# Patient Record
Sex: Female | Born: 1985 | Race: Black or African American | Hispanic: No | Marital: Single | Smoking: Never smoker
Health system: Southern US, Community
[De-identification: ages and names within clinical notes are randomized; demographics above are authoritative.]

## PROBLEM LIST (undated history)

## (undated) ENCOUNTER — Inpatient Hospital Stay (HOSPITAL_COMMUNITY): Payer: Self-pay

## (undated) DIAGNOSIS — Z789 Other specified health status: Secondary | ICD-10-CM

## (undated) DIAGNOSIS — Z227 Latent tuberculosis: Secondary | ICD-10-CM

## (undated) DIAGNOSIS — H9190 Unspecified hearing loss, unspecified ear: Secondary | ICD-10-CM

## (undated) HISTORY — DX: Latent tuberculosis: Z22.7

## (undated) HISTORY — DX: Unspecified hearing loss, unspecified ear: H91.90

## (undated) HISTORY — PX: NO PAST SURGERIES: SHX2092

---

## 2015-01-07 DIAGNOSIS — R7611 Nonspecific reaction to tuberculin skin test without active tuberculosis: Secondary | ICD-10-CM | POA: Insufficient documentation

## 2015-01-07 DIAGNOSIS — Z789 Other specified health status: Secondary | ICD-10-CM | POA: Insufficient documentation

## 2015-06-11 DIAGNOSIS — H9191 Unspecified hearing loss, right ear: Secondary | ICD-10-CM | POA: Insufficient documentation

## 2018-02-14 NOTE — L&D Delivery Note (Signed)
OB/GYN Faculty Practice Delivery Note  Kylii Ennis is a 33 y.o. [id number] s/p SVD at [redacted]w[redacted]d. She was admitted for SOL with concern for vaginal bleeding in MAU.   ROM: 1h 12m with clear fluid GBS Status:  Negative/-- (09/21 1627) Maximum Maternal Temperature: 98.21F  Labor Progress: . Patient presented to L&D for SOL. Initial SVE: 5/80/-3. Patient received AROM and Pitocin and quickly then progressed to complete.   Delivery Date/Time: 10/8 @ 1639 Delivery: Called to room and patient was complete and pushing. Head delivered in LOA position. No nuchal cord present. Shoulder and body delivered in usual fashion. Infant with spontaneous cry, placed on mother's abdomen, dried and stimulated. Cord clamped x 2 after 1-minute delay, and cut by medical student present. Cord blood drawn. Placenta delivered spontaneously with gentle cord traction. Fundus firm with massage and Pitocin. Labia, perineum, vagina, and cervix inspected inspected with no lacerations.  Baby Weight: pending  Placenta: Sent to L&D Complications: None Lacerations: None EBL: 100 mL Analgesia: None   Infant: APGAR (1 MIN): 8   APGAR (5 MINS): 9   APGAR (10 MINS):     Barrington Ellison, MD [facility] Family Medicine Fellow, [facility] for [facility] 11/22/2018, 4:59 PM

## 2018-06-12 ENCOUNTER — Inpatient Hospital Stay (HOSPITAL_COMMUNITY): Payer: Medicaid Other

## 2018-06-12 ENCOUNTER — Encounter (HOSPITAL_COMMUNITY): Payer: Self-pay | Admitting: *Deleted

## 2018-06-12 ENCOUNTER — Other Ambulatory Visit: Payer: Self-pay

## 2018-06-12 ENCOUNTER — Inpatient Hospital Stay (HOSPITAL_COMMUNITY)
Admission: EM | Admit: 2018-06-12 | Discharge: 2018-06-12 | Disposition: A | Discharge status: Home / Self Care | Payer: Medicaid Other | Attending: Obstetrics and Gynecology | Admitting: Obstetrics and Gynecology

## 2018-06-12 DIAGNOSIS — M79606 Pain in leg, unspecified: Secondary | ICD-10-CM | POA: Diagnosis not present

## 2018-06-12 DIAGNOSIS — Z3A15 15 weeks gestation of pregnancy: Secondary | ICD-10-CM | POA: Insufficient documentation

## 2018-06-12 DIAGNOSIS — R102 Pelvic and perineal pain: Secondary | ICD-10-CM | POA: Diagnosis not present

## 2018-06-12 DIAGNOSIS — M545 Low back pain, unspecified: Secondary | ICD-10-CM

## 2018-06-12 DIAGNOSIS — O26892 Other specified pregnancy related conditions, second trimester: Secondary | ICD-10-CM | POA: Insufficient documentation

## 2018-06-12 DIAGNOSIS — R519 Headache, unspecified: Secondary | ICD-10-CM

## 2018-06-12 DIAGNOSIS — O219 Vomiting of pregnancy, unspecified: Secondary | ICD-10-CM | POA: Insufficient documentation

## 2018-06-12 DIAGNOSIS — K117 Disturbances of salivary secretion: Secondary | ICD-10-CM | POA: Diagnosis not present

## 2018-06-12 DIAGNOSIS — O26899 Other specified pregnancy related conditions, unspecified trimester: Secondary | ICD-10-CM

## 2018-06-12 DIAGNOSIS — R51 Headache: Secondary | ICD-10-CM | POA: Diagnosis not present

## 2018-06-12 DIAGNOSIS — R109 Unspecified abdominal pain: Secondary | ICD-10-CM | POA: Insufficient documentation

## 2018-06-12 HISTORY — DX: Other specified health status: Z78.9

## 2018-06-12 LAB — CBC
HCT: 35.6 % — ABNORMAL LOW (ref 36.0–46.0)
Hemoglobin: 12.8 g/dL (ref 12.0–15.0)
MCH: 31.4 pg (ref 26.0–34.0)
MCHC: 36 g/dL (ref 30.0–36.0)
MCV: 87.3 fL (ref 80.0–100.0)
Platelets: 199 10*3/uL (ref 150–400)
RBC: 4.08 MIL/uL (ref 3.87–5.11)
RDW: 13.2 % (ref 11.5–15.5)
WBC: 5.4 10*3/uL (ref 4.0–10.5)
nRBC: 0 % (ref 0.0–0.2)

## 2018-06-12 LAB — WET PREP, GENITAL
Clue Cells Wet Prep HPF POC: NONE SEEN
Sperm: NONE SEEN
Trich, Wet Prep: NONE SEEN
Yeast Wet Prep HPF POC: NONE SEEN

## 2018-06-12 LAB — COMPREHENSIVE METABOLIC PANEL
ALT: 10 U/L (ref 0–44)
AST: 15 U/L (ref 15–41)
Albumin: 3.4 g/dL — ABNORMAL LOW (ref 3.5–5.0)
Alkaline Phosphatase: 35 U/L — ABNORMAL LOW (ref 38–126)
Anion gap: 11 (ref 5–15)
BUN: 5 mg/dL — ABNORMAL LOW (ref 6–20)
CO2: 19 mmol/L — ABNORMAL LOW (ref 22–32)
Calcium: 9.8 mg/dL (ref 8.9–10.3)
Chloride: 106 mmol/L (ref 98–111)
Creatinine, Ser: 0.5 mg/dL (ref 0.44–1.00)
GFR calc Af Amer: >60 mL/min (ref >60)
GFR calc non Af Amer: >60 mL/min (ref >60)
Glucose, Bld: 84 mg/dL (ref 70–99)
Potassium: 3.7 mmol/L (ref 3.5–5.1)
Sodium: 136 mmol/L (ref 135–145)
Total Bilirubin: 0.6 mg/dL (ref 0.3–1.2)
Total Protein: 6.4 g/dL — ABNORMAL LOW (ref 6.5–8.1)

## 2018-06-12 LAB — URINALYSIS, ROUTINE W REFLEX MICROSCOPIC
Bilirubin Urine: NEGATIVE
Glucose, UA: 50 mg/dL — AB
Hgb urine dipstick: NEGATIVE
Ketones, ur: 80 mg/dL — AB
Nitrite: NEGATIVE
Protein, ur: 30 mg/dL — AB
Specific Gravity, Urine: 1.03 (ref 1.005–1.030)
pH: 5 (ref 5.0–8.0)

## 2018-06-12 LAB — POCT PREGNANCY, URINE: Preg Test, Ur: POSITIVE — AB

## 2018-06-12 LAB — HCG, QUANTITATIVE, PREGNANCY: hCG, Beta Chain, Quant, S: 49258 m[IU]/mL — ABNORMAL HIGH (ref <5)

## 2018-06-12 MED ORDER — FOLIC ACID 400 MCG PO TABS: 400.0000 ug | ORAL_TABLET | Freq: Every day | ORAL | 2 refills | Status: DC

## 2018-06-12 MED ORDER — GLYCOPYRROLATE 1 MG PO TABS: 1.0000 mg | ORAL_TABLET | Freq: Three times a day (TID) | ORAL | 0 refills | Status: DC

## 2018-06-12 MED ORDER — ONDANSETRON 8 MG PO TBDP: 8.0000 mg | ORAL_TABLET | Freq: Three times a day (TID) | ORAL | 0 refills | Status: DC | PRN

## 2018-06-12 MED ORDER — ONDANSETRON HCL 4 MG/2ML IJ SOLN
4.0000 mg | Freq: Once | INTRAMUSCULAR | Status: AC
  Administered 2018-06-12: 4 mg via INTRAVENOUS
  Filled 2018-06-12: qty 2

## 2018-06-12 MED ORDER — LACTATED RINGERS IV BOLUS
1000.0000 mL | Freq: Once | INTRAVENOUS | Status: AC
  Administered 2018-06-12: 1000 mL via INTRAVENOUS

## 2018-06-12 MED ORDER — HYDROMORPHONE HCL 1 MG/ML IJ SOLN
1.0000 mg | Freq: Once | INTRAMUSCULAR | Status: AC
  Administered 2018-06-12: 1 mg via INTRAVENOUS
  Filled 2018-06-12: qty 1

## 2018-06-12 NOTE — MAU Note (Addendum)
Feels weak today, tired to breath.  Feels nauseated and wants to spit from time to time

## 2018-06-12 NOTE — MAU Note (Signed)
Having a very bad headache, pain in low back, lower abd and in her legs.  Started last night. lmp in Jan, saw dr in Feb, was told she is preg.Marland Kitchen  Has not been seen since.

## 2018-06-12 NOTE — MAU Provider Note (Signed)
History     CSN: [id number]  Arrival date and time: 06/12/18 1212   First Provider Initiated Contact with Patient 06/12/18 1330      Chief Complaint  Patient presents with  . Back Pain  . Headache  . Leg Pain  . Abdominal Pain   Ms. Lavon Horn is a 33 y.o. [id number] at Unknown who presents to MAU for HA, backache, lower abdominal pain, leg pain. Pt reports LMP 02/2018, exact day unknown. Pt denies any PMH.  Onset: yesterday evening Location: head, low back, lower abdomen, upper thighs Duration: <24hrs Character: burning sensation in back/lower abdomen/legs Aggravating/Associated: none/none  Relieving: none Treatment: none Severity: HA "severe, very bad, painful"  Pt reports on-going nausea with desire to spit constantly. Pt reports she carries around a cup to spit in. Pt was seen by family medicine and given RX for nausea medication, but pt reports she did not take the nausea medication as prescribed because she reports it made her vomit more. Pt does not remember the name of the medication that was given to her for nausea. Pt also given PNVs at this same visit and reports they made her more nauseous as well, so she stopped taking those. Pt reports she vomited once this morning.  Pt reports "a kind of liquid" coming out of the vagina. Denies odor/itching.  Pt denies VB. Pt denies constipation, diarrhea, or urinary problems. Pt denies fever, chills, fatigue, sweating or changes in appetite. Pt denies SOB or chest pain. Pt denies dizziness, light-headedness, weakness.  Problems this pregnancy include: none, pt has not yet been seen this pregnancy. Allergies? NKDA Current medications/supplements? none Pt is not currently breastfeeding. Prenatal care provider? none, pt has not yet been seen this pregnancy  Translation services used for entire visit.   OB History    Gravida  5   Para  3   Term      Preterm      AB  1   Living  3     SAB  1   TAB       Ectopic      Multiple      Live Births  3           Past Medical History:  Diagnosis Date  . Medical history non-contributory     Past Surgical History:  Procedure Laterality Date  . NO PAST SURGERIES      History reviewed. No pertinent family history.  Social History   Tobacco Use  . Smoking status: Never Smoker  . Smokeless tobacco: Never Used  Substance Use Topics  . Alcohol use: Never    Frequency: Never  . Drug use: Never   Allergies: No Known Allergies  No medications prior to admission.    Review of Systems  Constitutional: Negative for appetite change, chills, diaphoresis, fatigue and fever.  Respiratory: Negative for shortness of breath.   Cardiovascular: Negative for chest pain.  Gastrointestinal: Positive for abdominal pain, nausea and vomiting. Negative for constipation and diarrhea.  Genitourinary: Positive for pelvic pain and vaginal discharge. Negative for dysuria, flank pain, frequency, urgency and vaginal bleeding.  Musculoskeletal: Positive for back pain.  Neurological: Positive for headaches. Negative for dizziness, weakness and light-headedness.   Physical Exam   Blood pressure (!) 102/58, pulse (!) 101, temperature 98.8 F (37.1 C), temperature source Oral, resp. rate 20, height  (1.626 m), weight 69.8 kg, SpO2 100 %.   Patient Vitals for the past 24 hrs:  BP Temp Temp  src Pulse Resp SpO2 Height Weight  06/12/18 1850 (!) 102/58 98.8 F (37.1 C) Oral (!) 101 20 100 % - -  06/12/18 1259 (!) 92/45 98.8 F (37.1 C) Oral 87 18 100 % 5\' 4"  (1.626 m) 69.8 kg   Physical Exam  Constitutional: She is oriented to person, place, and time. She appears well-developed and well-nourished. No distress.  HENT:  Head: Normocephalic and atraumatic.  Respiratory: Effort normal.  GI: Soft. She exhibits no distension and no mass. There is abdominal tenderness in the right lower quadrant, suprapubic area and left lower quadrant. There is no  rebound and no guarding.  Genitourinary: There is no rash, tenderness or lesion on the right labia. There is no rash, tenderness or lesion on the left labia. Uterus is tender. Uterus is not fixed. Cervix exhibits no motion tenderness, no discharge and no friability. Right adnexum displays tenderness. Right adnexum displays no mass and no fullness. Left adnexum displays tenderness. Left adnexum displays no mass and no fullness.    Vaginal discharge (thin, white) and tenderness present.     No vaginal bleeding.  There is tenderness in the vagina. No bleeding in the vagina.    Genitourinary Comments: Cervix appears closed visually, closed on bimanual exam   Neurological: She is alert and oriented to person, place, and time.  Skin: Skin is warm and dry. She is not diaphoretic.  Psychiatric: She has a normal mood and affect. Her behavior is normal.   Results for orders placed or performed during the hospital encounter of 06/12/18 (from the past 24 hour(s))  Pregnancy, urine POC     Status: Abnormal   Collection Time: 06/12/18  1:17 PM  Result Value Ref Range   Preg Test, Ur POSITIVE (A) NEGATIVE  Urinalysis, Routine w reflex microscopic     Status: Abnormal   Collection Time: 06/12/18  1:18 PM  Result Value Ref Range   Color, Urine AMBER (A) YELLOW   APPearance CLEAR CLEAR   Specific Gravity, Urine 1.030 1.005 - 1.030   pH 5.0 5.0 - 8.0   Glucose, UA 50 (A) NEGATIVE mg/dL   Hgb urine dipstick NEGATIVE NEGATIVE   Bilirubin Urine NEGATIVE NEGATIVE   Ketones, ur 80 (A) NEGATIVE mg/dL   Protein, ur 30 (A) NEGATIVE mg/dL   Nitrite NEGATIVE NEGATIVE   Leukocytes,Ua SMALL (A) NEGATIVE   RBC / HPF 0-5 0 - 5 RBC/hpf   WBC, UA 6-10 0 - 5 WBC/hpf   Bacteria, UA RARE (A) NONE SEEN   Squamous Epithelial / LPF 0-5 0 - 5   Mucus PRESENT   Wet prep, genital     Status: Abnormal   Collection Time: 06/12/18  1:42 PM  Result Value Ref Range   Yeast Wet Prep HPF POC NONE SEEN NONE SEEN   Trich, Wet Prep  NONE SEEN NONE SEEN   Clue Cells Wet Prep HPF POC NONE SEEN NONE SEEN   WBC, Wet Prep HPF POC MANY (A) NONE SEEN   Sperm NONE SEEN   CBC     Status: Abnormal   Collection Time: 06/12/18  2:35 PM  Result Value Ref Range   WBC 5.4 4.0 - 10.5 K/uL   RBC 4.08 3.87 - 5.11 MIL/uL   Hemoglobin 12.8 12.0 - 15.0 g/dL   HCT 45.3 (L) 64.6 - 80.3 %   MCV 87.3 80.0 - 100.0 fL   MCH 31.4 26.0 - 34.0 pg   MCHC 36.0 30.0 - 36.0 g/dL   RDW 21.2  11.5 - 15.5 %   Platelets 199 150 - 400 K/uL   nRBC 0.0 0.0 - 0.2 %  Comprehensive metabolic panel     Status: Abnormal   Collection Time: 06/12/18  2:35 PM  Result Value Ref Range   Sodium 136 135 - 145 mmol/L   Potassium 3.7 3.5 - 5.1 mmol/L   Chloride 106 98 - 111 mmol/L   CO2 19 (L) 22 - 32 mmol/L   Glucose, Bld 84 70 - 99 mg/dL   BUN 5 (L) 6 - 20 mg/dL   Creatinine, Ser 1.61 0.44 - 1.00 mg/dL   Calcium 9.8 8.9 - 09.6 mg/dL   Total Protein 6.4 (L) 6.5 - 8.1 g/dL   Albumin 3.4 (L) 3.5 - 5.0 g/dL   AST 15 15 - 41 U/L   ALT 10 0 - 44 U/L   Alkaline Phosphatase 35 (L) 38 - 126 U/L   Total Bilirubin 0.6 0.3 - 1.2 mg/dL   GFR calc non Af Amer >60 >60 mL/min   GFR calc Af Amer >60 >60 mL/min   Anion gap 11 5 - 15  ABO/Rh     Status: None   Collection Time: 06/12/18  2:35 PM  Result Value Ref Range   ABO/RH(D)      O POS Performed at [facility], [address] [place], [place] [address]   hCG, quantitative, pregnancy     Status: Abnormal   Collection Time: 06/12/18  2:35 PM  Result Value Ref Range   hCG, Beta Chain, Quant, S 49,258 (H) <5 mIU/mL   [place] Mfm Ob Comp + 14 Wk  Result Date: 06/12/2018 ----------------------------------------------------------------------  OBSTETRICS REPORT                       (Signed Final 06/12/2018 05:00 pm) ---------------------------------------------------------------------- Patient Info  ID #:       [id number]                          D.O.B.:  16-Sep-1985 (33 yrs)  Name:       Jessica Bender                       Visit Date: 06/12/2018 04:39 pm              [place] ---------------------------------------------------------------------- Performed By  Performed By:     Benjamine Mola Vics             Referred By:      Marylen Ponto                    RDMS,RVT  Attending:        Lin Landsman      [facility] ---------------------------------------------------------------------- Orders   #  Description                          Code         Ordered By   1  [place] MFM OB COMP +  14 WK               76805.01     Kerry Chisolm  ----------------------------------------------------------------------   #  Order #                    Accession #                 Episode #   1  [id number]                  [id number]                  [id number]  ---------------------------------------------------------------------- Indications   Pelvic pain affecting pregnancy in second      O26.892   trimester   [redacted] weeks gestation of pregnancy                Z3A.15  ---------------------------------------------------------------------- Fetal Evaluation  Num Of Fetuses:         1  Fetal Heart Rate(bpm):  154  Cardiac Activity:       Observed  Presentation:           Breech  Placenta:               Posterior  P. Cord Insertion:      Marginal insertion  Amniotic Fluid  AFI FV:      Within normal limits                              Largest Pocket(cm)                              3.64 ---------------------------------------------------------------------- Biometry  BPD:      31.3  mm     G. Age:  15w 6d         64  %    CI:        75.88   %    70 - 86                                                          FL/HC:      15.3   %    15.3 - 17.1  HC:      113.9  mm     G. Age:  15w 4d         40  %    HC/AC:      1.30        1.05 - 1.39  AC:       87.8  mm     G. Age:  15w 0d         39  %    FL/BPD:     55.6   %  FL:       17.4  mm     G. Age:  15w 1d         34  %     FL/AC:      19.8   %    20 - 24  HUM:        17  mm     G. Age:  14w 6d  36  %  CER:      14.8  mm     G. Age:  14w 6d         36  %  NFT:       2.4  mm  LV:          6  mm  CM:          3  mm  Est. FW:     116  gm      0 lb 4 oz     67  % ---------------------------------------------------------------------- OB History  Gravidity:    5         Term:   3         SAB:   1  Living:       3 ---------------------------------------------------------------------- Gestational Age  U/S Today:     15w 3d                                        EDD:   12/01/18  Best:          15w 3d     Det. By:  [redacted] weeks gestation of    EDD:   12/01/18                                      pregnancy ---------------------------------------------------------------------- Anatomy  Cranium:               Appears normal         LVOT:                   Not well visualized  Cavum:                 Appears normal         Aortic Arch:            Not well visualized  Ventricles:            Appears normal         Ductal Arch:            Not well visualized  Choroid Plexus:        Appears normal         Diaphragm:              Not well visualized  Cerebellum:            Appears normal         Stomach:                Appears normal, left                                                                        sided  Posterior Fossa:       Appears normal         Abdomen:                Appears normal  Nuchal Fold:           Appears normal  Abdominal Wall:         Appears nml (cord                                                                        insert, abd wall)  Face:                  Not well visualized    Cord Vessels:           Appears normal (3                                                                        vessel cord)  Lips:                  Not well visualized    Kidneys:                Appear normal  Palate:                Not well visualized    Bladder:                Appears normal  Thoracic:              Appears normal          Spine:                  Not well visualized  Heart:                 Not well visualized    Upper Extremities:      Visualized  RVOT:                  Not well visualized    Lower Extremities:      Visualized  Other:  Technically difficult due to early gestational age and fetal position. ---------------------------------------------------------------------- Cervix Uterus Adnexa  Cervix  Length:            5.6  cm.  Normal appearance by transabdominal scan.  Uterus  No abnormality visualized.  Left Ovary  Size(cm)     2.97   x   1.91   x  1.97      Vol(ml): 5.85  Within normal limits.  Right Ovary  Not visualized. ---------------------------------------------------------------------- Impression  Normal interval growth.  No ultrasonic evidence of structural  fetal anomalies.  Suboptimal views of the fetal anatomy was obtained  secondary to early gestational age. ---------------------------------------------------------------------- Recommendations  Follow up anatomy in 5 weeks ----------------------------------------------------------------------               Lin Landsmanorenthian Booker, MD Electronically Signed Final Report   06/12/2018 05:00 pm ----------------------------------------------------------------------   MAU Course  Procedures  MDM -pain (HA, backache, lower abdominal pain, upper thighs)       -1mg  Dilaudid +1L LR given, pain resolved in abdomen/back/thighs, HA lessened significantly -nausea with desire to spit constantly       -Zofran given IV, nausea almost completely  resolved, pt reports mild       -PO Challenge successful       -RX given for Zofran + Robinul on discharge -UA: amber/50GLU/80 ketones/30PRO/leuks small/rare bacteria, sending for culture -WetPrep: +WBCs, otherwise WNL -GC/CT collected -CBC: WNL, H/H 12.8/35.6 -CMP: WNL for pregnancy -ABO: O Positive -hCG: 49,258 -[place]: [redacted]w[redacted]d, CL 5.6cm, marginal cord insertion, posterior placenta, AFI WNL, large placental lake on  images -relayed above information to patient through translation services -pt discharged to home in stable condition  Orders Placed This Encounter  Procedures  . Wet prep, genital    Standing Status:   Standing    Number of Occurrences:   1  . Culture, OB Urine    Standing Status:   Standing    Number of Occurrences:   1  . [place] MFM OB COMP + 14 WK    Standing Status:   Standing    Number of Occurrences:   1    Order Specific Question:   Symptom/Reason for Exam    Answer:   Pelvic pain in pregnancy [960454]  . Urinalysis, Routine w reflex microscopic    Standing Status:   Standing    Number of Occurrences:   1  . CBC    Standing Status:   Standing    Number of Occurrences:   1  . Comprehensive metabolic panel    Standing Status:   Standing    Number of Occurrences:   1  . hCG, quantitative, pregnancy    Standing Status:   Standing    Number of Occurrences:   1  . Pregnancy, urine POC    Standing Status:   Standing    Number of Occurrences:   1  . ABO/Rh    Standing Status:   Standing    Number of Occurrences:   1  . Insert peripheral IV    Standing Status:   Standing    Number of Occurrences:   1  . Discharge patient    Order Specific Question:   Discharge disposition    Answer:   01-Home or Self Care [1]    Order Specific Question:   Discharge patient date    Answer:   06/12/2018   Meds ordered this encounter  Medications  . lactated ringers bolus 1,000 mL  . HYDROmorphone (DILAUDID) injection 1 mg  . ondansetron (ZOFRAN) injection 4 mg  . folic acid (FOLVITE) 400 MCG tablet    Sig: Take 1 tablet (400 mcg total) by mouth daily.    Dispense:  60 tablet    Refill:  2    Order Specific Question:   Supervising Provider    Answer:   Levie Heritage [[id number]]  . glycopyrrolate (ROBINUL) 1 MG tablet    Sig: Take 1 tablet (1 mg total) by mouth 3 (three) times daily for 30 days.    Dispense:  90 tablet    Refill:  0    Order Specific Question:   Supervising Provider     Answer:   Levie Heritage [[id number]]  . ondansetron (ZOFRAN ODT) 8 MG disintegrating tablet    Sig: Take 1 tablet (8 mg total) by mouth every 8 (eight) hours as needed for nausea or vomiting.    Dispense:  20 tablet    Refill:  0    Order Specific Question:   Supervising Provider    Answer:   Levie Heritage [[id number]]   Assessment and Plan   1. Abdominal pain in pregnancy, second  trimester   2. Pelvic pain in pregnancy   3. Nausea and vomiting in pregnancy   4. Acute nonintractable headache, unspecified headache type   5. Acute midline low back pain without sciatica   6. Ptyalism    Allergies as of 06/12/2018   No Known Allergies     Medication List    TAKE these medications   folic acid 400 MCG tablet Commonly known as:  FOLVITE Take 1 tablet (400 mcg total) by mouth daily.   glycopyrrolate 1 MG tablet Commonly known as:  Robinul Take 1 tablet (1 mg total) by mouth 3 (three) times daily for 30 days.   ondansetron 8 MG disintegrating tablet Commonly known as:  Zofran ODT Take 1 tablet (8 mg total) by mouth every 8 (eight) hours as needed for nausea or vomiting.      -discussed relationship between PNVs and nausea, RX sent for folic acid -will call with culture results, if positive -RX for Robinul given for spitting -RX for Zofran given for nausea -discussed appropriate hydration in pregnancy -discussed safe medications in pregnancy, highlighting Tylenol for pain, list given -pt strongly encouraged to set up NOB appt with provider of choice this week, pt given list of [place] OB providers at discharge -discussed pain/hyperemesis/return MAU precautions -pt questions asked and answered -pt discharged to home in stable condition  Joni Reining E Jaidyn Kuhl 06/12/2018, 7:14 PM

## 2018-06-12 NOTE — MAU Note (Signed)
Sent up from ED. "3 months preg w/ lower abd and lower back pain".

## 2018-06-12 NOTE — Discharge Instructions (Signed)
Safe Medications in Pregnancy    Acne: Benzoyl Peroxide Salicylic Acid  Backache/Headache: Tylenol: 2 regular strength every 4 hours OR              2 Extra strength every 6 hours  Colds/Coughs/Allergies: Benadryl (alcohol free) 25 mg every 6 hours as needed Breath right strips Claritin Cepacol throat lozenges Chloraseptic throat spray Cold-Eeze- up to three times per day Cough drops, alcohol free Flonase (by prescription only) Guaifenesin Mucinex Robitussin DM (plain only, alcohol free) Saline nasal spray/drops Sudafed (pseudoephedrine) & Actifed ** use only after [redacted] weeks gestation and if you do not have high blood pressure Tylenol Vicks Vaporub Zinc lozenges Zyrtec   Constipation: Colace Ducolax suppositories Fleet enema Glycerin suppositories Metamucil Milk of magnesia Miralax Senokot Smooth move tea  Diarrhea: Kaopectate Imodium A-D  *NO pepto Bismol  Hemorrhoids: Anusol Anusol HC Preparation H Tucks  Indigestion: Tums Maalox Mylanta Zantac  Pepcid  Insomnia: Benadryl (alcohol free)  every 6 hours as needed Tylenol PM Unisom, no Gelcaps  Leg Cramps: Tums MagGel  Nausea/Vomiting:  Bonine Dramamine Emetrol Ginger extract Sea bands Meclizine  Nausea medication to take during pregnancy:  Unisom (doxylamine succinate 25 mg tablets) Take one tablet daily at bedtime. If symptoms are not adequately controlled, the dose can be increased to a maximum recommended dose of two tablets daily (1/2 tablet in the morning, 1/2 tablet mid-afternoon and one at bedtime). Vitamin B6  tablets. Take one tablet twice a day (up to 200 mg per day).  Skin Rashes: Aveeno products Benadryl cream or  every 6 hours as needed Calamine Lotion 1% cortisone cream  Yeast infection: Gyne-lotrimin 7 Monistat 7   **If taking multiple medications, please check labels to avoid duplicating the same active ingredients **take  medication as directed on the label ** Do not exceed 4000 mg of tylenol in 24 hours **Do not take medications that contain aspirin or ibuprofen  [facility]     Phone: [phone]  [facility] [employer] [facility] [place]  Phone: [phone]  [facility] [employer] [facility] [place]  Phone: [phone]  [facility] [place]                           Phone: [phone]  [facility]          Phone: [phone]  [facility]    Phone: [phone]   [facility])    Phone: [phone]  [facility]    Phone: [phone]  [facility]    Phone: [phone]  [facility]    Phone: [phone]  [facility]  Phone: [phone]  [facility]               Phone: [phone]  [facility]   Phone: [phone]  [facility]    Phone: [phone]  Abdominal Pain During Pregnancy  Abdominal pain is common during pregnancy, and has many possible causes. Some causes are more serious than others, and sometimes the cause is not known. Abdominal pain can be a sign that labor is starting. It can also be caused by normal growth and stretching of muscles and ligaments during pregnancy. Always tell your health care provider if you have any abdominal pain. Follow these instructions at home:  Do not  have sex or put anything in your vagina until your pain goes away completely.  Get plenty of rest until your pain improves.  Drink enough fluid to keep your urine pale yellow.  Take over-the-counter and prescription medicines only as told by your health care provider.  Keep all follow-up visits as told by your health care provider. This is important. Contact a health care  provider if:  Your pain continues or gets worse after resting.  You have lower abdominal pain that: ? Comes and goes at regular intervals. ? Spreads to your back. ? Is similar to menstrual cramps.  You have pain or burning when you urinate. Get help right away if:  You have a fever or chills.  You have vaginal bleeding.  You are leaking fluid from your vagina.  You are passing tissue from your vagina.  You have vomiting or diarrhea that lasts for more than 24 hours.  Your baby is moving less than usual.  You feel very weak or faint.  You have shortness of breath.  You develop severe pain in your upper abdomen. Summary  Abdominal pain is common during pregnancy, and has many possible causes.  If you experience abdominal pain during pregnancy, tell your health care provider right away.  Follow your health care provider's home care instructions and keep all follow-up visits as directed. This information is not intended to replace advice given to you by your health care provider. Make sure you discuss any questions you have with your health care provider. Document Released: 01/31/2005 Document Revised: 05/05/2016 Document Reviewed: 05/05/2016 Elsevier Interactive Patient Education  2019 [employer]  Morning Sickness  Morning sickness is when a woman feels nauseous during pregnancy. This nauseous feeling may or may not come with vomiting. It often occurs in the morning, but it can be a problem at any time of day. Morning sickness is most common during the first trimester. In some cases, it may continue throughout pregnancy. Although morning sickness is unpleasant, it is usually harmless unless the woman develops severe and continual vomiting (hyperemesis gravidarum), a condition that requires more intense treatment. What are the causes? The exact cause of this condition is not known, but it seems to be related to normal hormonal changes that occur in pregnancy. What  increases the risk? You are more likely to develop this condition if:  You experienced nausea or vomiting before your pregnancy.  You had morning sickness during a previous pregnancy.  You are pregnant with more than one baby, such as twins. What are the signs or symptoms? Symptoms of this condition include:  Nausea.  Vomiting. How is this diagnosed? This condition is usually diagnosed based on your signs and symptoms. How is this treated? In many cases, treatment is not needed for this condition. Making some changes to what you eat may help to control symptoms. Your health care provider may also prescribe or recommend:  Vitamin B6 supplements.  Anti-nausea medicines.  Ginger. Follow these instructions at home: Medicines  Take over-the-counter and prescription medicines only as told by your health care provider. Do not use any prescription, over-the-counter, or herbal medicines for morning sickness without first talking with your health care provider.  Taking multivitamins before getting pregnant can prevent or decrease the severity of morning sickness in most women. Eating and drinking  Eat a piece of dry toast or crackers before getting out of bed in the morning.  Eat 5 or 6 small meals a day.  Eat dry and bland foods,  such as rice or a baked potato. Foods that are high in carbohydrates are often helpful.  Avoid greasy, fatty, and spicy foods.  Have someone cook for you if the smell of any food causes nausea and vomiting.  If you feel nauseous after taking prenatal vitamins, take the vitamins at night or with a snack.  Snack on protein foods between meals if you are hungry. Nuts, yogurt, and cheese are good options.  Drink fluids throughout the day.  Try ginger ale made with real ginger, ginger tea made from fresh grated ginger, or ginger candies. General instructions  Do not use any products that contain nicotine or tobacco, such as cigarettes and e-cigarettes.  If you need help quitting, ask your health care provider.  Get an air purifier to keep the air in your house free of odors.  Get plenty of fresh air.  Try to avoid odors that trigger your nausea.  Consider trying these methods to help relieve symptoms: ? Wearing an acupressure wristband. These wristbands are often worn for seasickness. ? Acupuncture. Contact a health care provider if:  Your home remedies are not working and you need medicine.  You feel dizzy or light-headed.  You are losing weight. Get help right away if:  You have persistent and uncontrolled nausea and vomiting.  You faint.  You have severe pain in your abdomen. Summary  Morning sickness is when a woman feels nauseous during pregnancy. This nauseous feeling may or may not come with vomiting.  Morning sickness is most common during the first trimester.  It often occurs in the morning, but it can be a problem at any time of day.  In many cases, treatment is not needed for this condition. Making some changes to what you eat may help to control symptoms. This information is not intended to replace advice given to you by your health care provider. Make sure you discuss any questions you have with your health care provider. Document Released: 03/24/2006 Document Revised: 03/05/2016 Document Reviewed: 03/05/2016 Elsevier Interactive Patient Education  2019 [employer].

## 2018-06-13 LAB — CULTURE, OB URINE: Culture: <10000 — AB

## 2018-06-13 LAB — GC/CHLAMYDIA PROBE AMP (~~LOC~~) NOT AT ARMC
Chlamydia: NEGATIVE
Neisseria Gonorrhea: NEGATIVE

## 2018-06-13 LAB — ABO/RH: ABO/RH(D): O POS

## 2018-07-25 LAB — HEMOGLOBIN A1C: Hemoglobin A1C: 4.6

## 2018-07-25 LAB — SICKLE CELL SCREEN: Sickle Cell Screen: NEGATIVE

## 2018-07-25 LAB — OB RESULTS CONSOLE HIV ANTIBODY (ROUTINE TESTING): HIV: NONREACTIVE

## 2018-07-25 LAB — OB RESULTS CONSOLE HEPATITIS B SURFACE ANTIGEN: Hepatitis B Surface Ag: NEGATIVE

## 2018-07-25 LAB — OB RESULTS CONSOLE ANTIBODY SCREEN: Antibody Screen: NEGATIVE

## 2018-07-25 LAB — OB RESULTS CONSOLE ABO/RH: RH Type: POSITIVE

## 2018-07-25 LAB — OB RESULTS CONSOLE RUBELLA ANTIBODY, IGM: Rubella: IMMUNE

## 2018-07-25 LAB — OB RESULTS CONSOLE GC/CHLAMYDIA
Chlamydia: NEGATIVE
Gonorrhea: NEGATIVE

## 2018-07-25 LAB — OB RESULTS CONSOLE RPR: RPR: NONREACTIVE

## 2018-08-07 ENCOUNTER — Encounter (HOSPITAL_COMMUNITY): Payer: Self-pay | Admitting: *Deleted

## 2018-08-07 ENCOUNTER — Telehealth: Payer: Self-pay | Admitting: Family Medicine

## 2018-08-07 ENCOUNTER — Other Ambulatory Visit: Payer: Self-pay

## 2018-08-07 ENCOUNTER — Inpatient Hospital Stay (HOSPITAL_COMMUNITY)
Admission: AD | Admit: 2018-08-07 | Discharge: 2018-08-07 | Disposition: A | Discharge status: Home / Self Care | Payer: Medicaid Other | Attending: Obstetrics and Gynecology | Admitting: Obstetrics and Gynecology

## 2018-08-07 DIAGNOSIS — O9989 Other specified diseases and conditions complicating pregnancy, childbirth and the puerperium: Secondary | ICD-10-CM | POA: Diagnosis not present

## 2018-08-07 DIAGNOSIS — O26892 Other specified pregnancy related conditions, second trimester: Secondary | ICD-10-CM

## 2018-08-07 DIAGNOSIS — Z3689 Encounter for other specified antenatal screening: Secondary | ICD-10-CM | POA: Diagnosis not present

## 2018-08-07 DIAGNOSIS — O368120 Decreased fetal movements, second trimester, not applicable or unspecified: Secondary | ICD-10-CM | POA: Insufficient documentation

## 2018-08-07 DIAGNOSIS — R197 Diarrhea, unspecified: Secondary | ICD-10-CM | POA: Diagnosis not present

## 2018-08-07 DIAGNOSIS — R109 Unspecified abdominal pain: Secondary | ICD-10-CM | POA: Insufficient documentation

## 2018-08-07 DIAGNOSIS — O26899 Other specified pregnancy related conditions, unspecified trimester: Secondary | ICD-10-CM

## 2018-08-07 DIAGNOSIS — Z79899 Other long term (current) drug therapy: Secondary | ICD-10-CM | POA: Diagnosis not present

## 2018-08-07 DIAGNOSIS — Z3A23 23 weeks gestation of pregnancy: Secondary | ICD-10-CM | POA: Insufficient documentation

## 2018-08-07 LAB — CBC WITH DIFFERENTIAL/PLATELET
Abs Immature Granulocytes: 0.06 10*3/uL (ref 0.00–0.07)
Basophils Absolute: 0 10*3/uL (ref 0.0–0.1)
Basophils Relative: 0 %
Eosinophils Absolute: 0.1 10*3/uL (ref 0.0–0.5)
Eosinophils Relative: 1 %
HCT: 35.9 % — ABNORMAL LOW (ref 36.0–46.0)
Hemoglobin: 12.3 g/dL (ref 12.0–15.0)
Immature Granulocytes: 1 %
Lymphocytes Relative: 23 %
Lymphs Abs: 1.9 10*3/uL (ref 0.7–4.0)
MCH: 32.3 pg (ref 26.0–34.0)
MCHC: 34.3 g/dL (ref 30.0–36.0)
MCV: 94.2 fL (ref 80.0–100.0)
Monocytes Absolute: 0.5 10*3/uL (ref 0.1–1.0)
Monocytes Relative: 6 %
Neutro Abs: 5.5 10*3/uL (ref 1.7–7.7)
Neutrophils Relative %: 69 %
Platelets: 211 10*3/uL (ref 150–400)
RBC: 3.81 MIL/uL — ABNORMAL LOW (ref 3.87–5.11)
RDW: 13.9 % (ref 11.5–15.5)
WBC: 8 10*3/uL (ref 4.0–10.5)
nRBC: 0 % (ref 0.0–0.2)

## 2018-08-07 LAB — COMPREHENSIVE METABOLIC PANEL
ALT: 9 U/L (ref 0–44)
AST: 15 U/L (ref 15–41)
Albumin: 3.1 g/dL — ABNORMAL LOW (ref 3.5–5.0)
Alkaline Phosphatase: 46 U/L (ref 38–126)
Anion gap: 7 (ref 5–15)
BUN: 6 mg/dL (ref 6–20)
CO2: 24 mmol/L (ref 22–32)
Calcium: 9.4 mg/dL (ref 8.9–10.3)
Chloride: 105 mmol/L (ref 98–111)
Creatinine, Ser: 0.37 mg/dL — ABNORMAL LOW (ref 0.44–1.00)
GFR calc Af Amer: >60 mL/min (ref >60)
GFR calc non Af Amer: >60 mL/min (ref >60)
Glucose, Bld: 73 mg/dL (ref 70–99)
Potassium: 4 mmol/L (ref 3.5–5.1)
Sodium: 136 mmol/L (ref 135–145)
Total Bilirubin: 0.6 mg/dL (ref 0.3–1.2)
Total Protein: 6.1 g/dL — ABNORMAL LOW (ref 6.5–8.1)

## 2018-08-07 LAB — URINALYSIS, ROUTINE W REFLEX MICROSCOPIC
Bilirubin Urine: NEGATIVE
Glucose, UA: NEGATIVE mg/dL
Hgb urine dipstick: NEGATIVE
Ketones, ur: 5 mg/dL — AB
Nitrite: NEGATIVE
Protein, ur: NEGATIVE mg/dL
Specific Gravity, Urine: 1.021 (ref 1.005–1.030)
pH: 6 (ref 5.0–8.0)

## 2018-08-07 MED ORDER — CYCLOBENZAPRINE HCL 10 MG PO TABS
10.0000 mg | ORAL_TABLET | Freq: Once | ORAL | Status: AC
  Administered 2018-08-07: 10 mg via ORAL
  Filled 2018-08-07: qty 1

## 2018-08-07 MED ORDER — CYCLOBENZAPRINE HCL 10 MG PO TABS: 10.0000 mg | ORAL_TABLET | Freq: Three times a day (TID) | ORAL | 0 refills | Status: DC | PRN

## 2018-08-07 NOTE — MAU Provider Note (Signed)
History     CSN: [id number]  Arrival date and time: 08/07/18 1054   First Provider Initiated Contact with Patient 08/07/18 1252      Chief Complaint  Patient presents with  . Decreased Fetal Movement  . Abdominal Pain  . Diarrhea   [name] is a 33 y.o. [id number] at 3535w3d who presents to MAU for "pain in my belly" and diarrhea. Upon clarification, pt reports it is not watery diarrhea, but loose/formed stools. Pt reports she has had 5 bowel movements since last night. Pt denies any unusual food last night for dinner and denies close contacts with similar symptoms.  Onset: last night Location: RLQ/LLQ of abdomen Duration: <24hrs Character: looser than normal stool, but still formed, pt describes pain as dull/aching Aggravating/Associated: none/decreased fetal movement Relieving: none Treatment: none Severity: 0/10, pt reports she last felt pain around 0830 this morning, last bowel movement @0730  this morning  Pt denies VB, LOF, ctx, vaginal discharge/odor/itching. Pt denies N/V, constipation, or urinary problems. Pt denies fever, chills, fatigue, sweating or changes in appetite. Pt denies SOB or chest pain. Pt denies dizziness, HA, light-headedness, weakness.  Problems this pregnancy include: N/V of pregnancy. Allergies? NKDA Current medications/supplements? PNVs Prenatal care provider? None, pt reports she tried to call all the clinics on our list and no one will pick up the phone. She also states that because she needs a Nurse, learning disabilitytranslator, she does not know what to do when the answering machine picks up. Pt states she would [facility] for care.   OB History    Gravida  5   Para  3   Term      Preterm      AB  1   Living  3     SAB  1   TAB      Ectopic      Multiple      Live Births  3           Past Medical History:  Diagnosis Date  . Medical history non-contributory     Past Surgical History:  Procedure Laterality  Date  . NO PAST SURGERIES      No family history on file.  Social History   Tobacco Use  . Smoking status: Never Smoker  . Smokeless tobacco: Never Used  Substance Use Topics  . Alcohol use: Never    Frequency: Never  . Drug use: Never    Allergies: No Known Allergies  Medications Prior to Admission  Medication Sig Dispense Refill Last Dose  . folic acid (FOLVITE) 400 MCG tablet Take 1 tablet (400 mcg total) by mouth daily. 60 tablet 2 08/06/2018 at Unknown time  . Prenatal Vit-Fe Fumarate-FA (MULTIVITAMIN-PRENATAL) 27-0.8 MG TABS tablet Take 1 tablet by mouth daily at 12 noon.   08/06/2018 at Unknown time  . glycopyrrolate (ROBINUL) 1 MG tablet Take 1 tablet (1 mg total) by mouth 3 (three) times daily for 30 days. 90 tablet 0   . ondansetron (ZOFRAN ODT) 8 MG disintegrating tablet Take 1 tablet (8 mg total) by mouth every 8 (eight) hours as needed for nausea or vomiting. 20 tablet 0 Unknown at Unknown time    Review of Systems  Constitutional: Negative for chills, diaphoresis, fatigue and fever.  Respiratory: Negative for shortness of breath.   Cardiovascular: Negative for chest pain.  Gastrointestinal: Positive for abdominal pain and diarrhea. Negative for constipation, nausea and vomiting.  Genitourinary: Negative for dysuria, flank pain, frequency,  pelvic pain, urgency, vaginal bleeding and vaginal discharge.  Neurological: Negative for dizziness, weakness, light-headedness and headaches.   Physical Exam   Blood pressure 103/76, pulse 72, temperature 98.1 F (36.7 C), temperature source Oral, resp. rate 16, weight 72.6 kg, SpO2 100 %.  Patient Vitals for the past 24 hrs:  BP Temp Temp src Pulse Resp SpO2 Weight  08/07/18 1116 103/76 98.1 F (36.7 C) Oral 72 16 100 % 72.6 kg   Physical Exam  Constitutional: She is oriented to person, place, and time. She appears well-developed and well-nourished. No distress.  HENT:  Head: Normocephalic and atraumatic.  Respiratory:  Effort normal.  GI: Soft. She exhibits no distension and no mass. There is abdominal tenderness in the right lower quadrant and left lower quadrant. There is no rebound and no guarding.  Genitourinary: There is no rash, tenderness or lesion on the right labia. There is no rash, tenderness or lesion on the left labia.    Genitourinary Comments: CE: long/closed/posterior   Neurological: She is alert and oriented to person, place, and time.  Skin: Skin is warm and dry. She is not diaphoretic.  Psychiatric: She has a normal mood and affect. Her behavior is normal. Judgment and thought content normal.   Results for orders placed or performed during the hospital encounter of 08/07/18 (from the past 24 hour(s))  Urinalysis, Routine w reflex microscopic     Status: Abnormal   Collection Time: 08/07/18 11:03 AM  Result Value Ref Range   Color, Urine YELLOW YELLOW   APPearance HAZY (A) CLEAR   Specific Gravity, Urine 1.021 1.005 - 1.030   pH 6.0 5.0 - 8.0   Glucose, UA NEGATIVE NEGATIVE mg/dL   Hgb urine dipstick NEGATIVE NEGATIVE   Bilirubin Urine NEGATIVE NEGATIVE   Ketones, ur 5 (A) NEGATIVE mg/dL   Protein, ur NEGATIVE NEGATIVE mg/dL   Nitrite NEGATIVE NEGATIVE   Leukocytes,Ua SMALL (A) NEGATIVE   RBC / HPF 0-5 0 - 5 RBC/hpf   WBC, UA 0-5 0 - 5 WBC/hpf   Bacteria, UA RARE (A) NONE SEEN   Squamous Epithelial / LPF 6-10 0 - 5   Mucus PRESENT   Comprehensive metabolic panel     Status: Abnormal   Collection Time: 08/07/18  1:30 PM  Result Value Ref Range   Sodium 136 135 - 145 mmol/L   Potassium 4.0 3.5 - 5.1 mmol/L   Chloride 105 98 - 111 mmol/L   CO2 24 22 - 32 mmol/L   Glucose, Bld 73 70 - 99 mg/dL   BUN 6 6 - 20 mg/dL   Creatinine, Ser 0.37 (L) 0.44 - 1.00 mg/dL   Calcium 9.4 8.9 - 10.3 mg/dL   Total Protein 6.1 (L) 6.5 - 8.1 g/dL   Albumin 3.1 (L) 3.5 - 5.0 g/dL   AST 15 15 - 41 U/L   ALT 9 0 - 44 U/L   Alkaline Phosphatase 46 38 - 126 U/L   Total Bilirubin 0.6 0.3 - 1.2 mg/dL    GFR calc non Af Amer >60 >60 mL/min   GFR calc Af Amer >60 >60 mL/min   Anion gap 7 5 - 15  CBC with Differential/Platelet     Status: Abnormal   Collection Time: 08/07/18  1:30 PM  Result Value Ref Range   WBC 8.0 4.0 - 10.5 K/uL   RBC 3.81 (L) 3.87 - 5.11 MIL/uL   Hemoglobin 12.3 12.0 - 15.0 g/dL   HCT 35.9 (L) 36.0 - 46.0 %  MCV 94.2 80.0 - 100.0 fL   MCH 32.3 26.0 - 34.0 pg   MCHC 34.3 30.0 - 36.0 g/dL   RDW 29.513.9 28.411.5 - 13.215.5 %   Platelets 211 150 - 400 K/uL   nRBC 0.0 0.0 - 0.2 %   Neutrophils Relative % 69 %   Neutro Abs 5.5 1.7 - 7.7 K/uL   Lymphocytes Relative 23 %   Lymphs Abs 1.9 0.7 - 4.0 K/uL   Monocytes Relative 6 %   Monocytes Absolute 0.5 0.1 - 1.0 K/uL   Eosinophils Relative 1 %   Eosinophils Absolute 0.1 0.0 - 0.5 K/uL   Basophils Relative 0 %   Basophils Absolute 0.0 0.0 - 0.1 K/uL   Immature Granulocytes 1 %   Abs Immature Granulocytes 0.06 0.00 - 0.07 K/uL   No results found.  MAU Course  Procedures  MDM -RLQ/LLQ mild abdominal tenderness, worse with bowel movements since last night, resolving -no N/V, temp 98.1 -DFM -UA: hazy/5ketones/sm leuks/rare bacteria, urine sent for culture -CBC w/ diff: WBCs 8, no abnormalities requiring treatment -CMP: no abnormalities requiring treatment -Flexeril given for pain, on repeat exam, pt reports pain is not present -CE: closed/long/posterior -no additional bowel movements during stay in MAU -EFM: reactive       -baseline: 140       -variability: moderate       -accels: present, 10x10       -decels: few variable       -TOCO: no ctx -pt discharged to home in stable condition  Orders Placed This Encounter  Procedures  . Culture, OB Urine    Standing Status:   Standing    Number of Occurrences:   1  . Urinalysis, Routine w reflex microscopic    Standing Status:   Standing    Number of Occurrences:   1  . Comprehensive metabolic panel    Standing Status:   Standing    Number of Occurrences:   1  .  CBC with Differential/Platelet    Standing Status:   Standing    Number of Occurrences:   1  . Discharge patient    Order Specific Question:   Discharge disposition    Answer:   01-Home or Self Care [1]    Order Specific Question:   Discharge patient date    Answer:   08/07/2018   Meds ordered this encounter  Medications  . cyclobenzaprine (FLEXERIL) tablet 10 mg  . cyclobenzaprine (FLEXERIL) 10 MG tablet    Sig: Take 1 tablet (10 mg total) by mouth 3 (three) times daily as needed for muscle spasms.    Dispense:  30 tablet    Refill:  0    Order Specific Question:   Supervising Provider    Answer:   Alysia PennaERVIN, MICHAEL L [[id number]]   Assessment and Plan   1. Abdominal pain during pregnancy in second trimester   2. Diarrhea during pregnancy   3. [redacted] weeks gestation of pregnancy   4. NST (non-stress test) reactive    Allergies as of 08/07/2018   No Known Allergies     Medication List    TAKE these medications   cyclobenzaprine 10 MG tablet Commonly known as: FLEXERIL Take 1 tablet (10 mg total) by mouth 3 (three) times daily as needed for muscle spasms.   folic acid 400 MCG tablet Commonly known as: FOLVITE Take 1 tablet (400 mcg total) by mouth daily.   glycopyrrolate 1 MG tablet Commonly known as: Robinul Take 1  tablet (1 mg total) by mouth 3 (three) times daily for 30 days.   multivitamin-prenatal 27-0.8 MG Tabs tablet Take 1 tablet by mouth daily at 12 noon.   ondansetron 8 MG disintegrating tablet Commonly known as: Zofran ODT Take 1 tablet (8 mg total) by mouth every 8 (eight) hours as needed for nausea or vomiting.      -discussed normal FM across [place], and monitoring movements starting at 28wks -message sent to ELAM to call pt to schedule NOB visit -list of safe meds in pregnancy given to include medications for diarrhea -will call with culture results, if positive -RX sent for flexeril per pt request -strict PTL/return MAU precautions given -pt discharged to home  in stable condition  Joni Reiningicole E Wen Munford 08/07/2018, 4:08 PM

## 2018-08-07 NOTE — MAU Note (Signed)
Last night she had pain in her lower belly, had diarrhea 4 times during the night, last was 0730. Still having pain.  Since last night hasn't felt the baby move. No one else at home is having diarrhea. Denies vomiting or fever.

## 2018-08-07 NOTE — MAU Note (Signed)
Tracing reviewed with Nugent.  Monitor held to get tracing.  abd soft on palpation.

## 2018-08-07 NOTE — Telephone Encounter (Signed)
Attempted to reach patient with an appointment. With help of interpreter [phone], a message was left to call the office. However, we do have you scheduled for an appointment on 06/24. You do not need to come in the office.

## 2018-08-07 NOTE — Discharge Instructions (Signed)
Abdominal Pain During Pregnancy  Abdominal pain is common during pregnancy, and has many possible causes. Some causes are more serious than others, and sometimes the cause is not known. Abdominal pain can be a sign that labor is starting. It can also be caused by normal growth and stretching of muscles and ligaments during pregnancy. Always tell your health care provider if you have any abdominal pain. Follow these instructions at home:  Do not have sex or put anything in your vagina until your pain goes away completely.  Get plenty of rest until your pain improves.  Drink enough fluid to keep your urine pale yellow.  Take over-the-counter and prescription medicines only as told by your health care provider.  Keep all follow-up visits as told by your health care provider. This is important. Contact a health care provider if:  Your pain continues or gets worse after resting.  You have lower abdominal pain that: ? Comes and goes at regular intervals. ? Spreads to your back. ? Is similar to menstrual cramps.  You have pain or burning when you urinate. Get help right away if:  You have a fever or chills.  You have vaginal bleeding.  You are leaking fluid from your vagina.  You are passing tissue from your vagina.  You have vomiting or diarrhea that lasts for more than 24 hours.  Your baby is moving less than usual.  You feel very weak or faint.  You have shortness of breath.  You develop severe pain in your upper abdomen. Summary  Abdominal pain is common during pregnancy, and has many possible causes.  If you experience abdominal pain during pregnancy, tell your health care provider right away.  Follow your health care provider's home care instructions and keep all follow-up visits as directed. This information is not intended to replace advice given to you by your health care provider. Make sure you discuss any questions you have with your health care  provider. Document Released: 01/31/2005 Document Revised: 05/05/2016 Document Reviewed: 05/05/2016 Elsevier Interactive Patient Education  2019 [employer].  Preterm Labor and Birth Information  The normal length of a pregnancy is 39-41 weeks. Preterm labor is when labor starts before 37 completed weeks of pregnancy. What are the risk factors for preterm labor? Preterm labor is more likely to occur in women who:  Have certain infections during pregnancy such as a bladder infection, sexually transmitted infection, or infection inside the uterus (chorioamnionitis).  Have a shorter-than-normal cervix.  Have gone into preterm labor before.  Have had surgery on their cervix.  Are younger than age 35 or older than age 46.  Are African American.  Are pregnant with twins or multiple babies (multiple gestation).  Take street drugs or smoke while pregnant.  Do not gain enough weight while pregnant.  Became pregnant shortly after having been pregnant. What are the symptoms of preterm labor? Symptoms of preterm labor include:  Cramps similar to those that can happen during a menstrual period. The cramps may happen with diarrhea.  Pain in the abdomen or lower back.  Regular uterine contractions that may feel like tightening of the abdomen.  A feeling of increased pressure in the pelvis.  Increased watery or bloody mucus discharge from the vagina.  Water breaking (ruptured amniotic sac). Why is it important to recognize signs of preterm labor? It is important to recognize signs of preterm labor because babies who are born prematurely may not be fully developed. This can put them at an  increased risk for:  Long-term (chronic) heart and lung problems.  Difficulty immediately after birth with regulating body systems, including blood sugar, body temperature, heart rate, and breathing rate.  Bleeding in the brain.  Cerebral palsy.  Learning difficulties.  Death. These risks  are highest for babies who are born before 87 weeks of pregnancy. How is preterm labor treated? Treatment depends on the length of your pregnancy, your condition, and the health of your baby. It may involve:  Having a stitch (suture) placed in your cervix to prevent your cervix from opening too early (cerclage).  Taking or being given medicines, such as: ? Hormone medicines. These may be given early in pregnancy to help support the pregnancy. ? Medicine to stop contractions. ? Medicines to help mature the babys lungs. These may be prescribed if the risk of delivery is high. ? Medicines to prevent your baby from developing cerebral palsy. If the labor happens before 34 weeks of pregnancy, you may need to stay in the hospital. What should I do if I think I am in preterm labor? If you think that you are going into preterm labor, call your health care provider right away. How can I prevent preterm labor in future pregnancies? To increase your chance of having a full-term pregnancy:  Do not use any tobacco products, such as cigarettes, chewing tobacco, and e-cigarettes. If you need help quitting, ask your health care provider.  Do not use street drugs or medicines that have not been prescribed to you during your pregnancy.  Talk with your health care provider before taking any herbal supplements, even if you have been taking them regularly.  Make sure you gain a healthy amount of weight during your pregnancy.  Watch for infection. If you think that you might have an infection, get it checked right away.  Make sure to tell your health care provider if you have gone into preterm labor before. This information is not intended to replace advice given to you by your health care provider. Make sure you discuss any questions you have with your health care provider. Document Released: 04/23/2003 Document Revised: 07/14/2015 Document Reviewed: 06/24/2015 Elsevier Interactive Patient Education  2019  [facility]     Phone: [phone]  [facility] [place] [facility]  Phone: [phone]  [facility] [employer] [facility] [place]  Phone: [place] for [employer] at [place]                           Phone: [place] for [place] [facility]          Phone: [phone]  [facility]    Phone: [phone]   [facility])    Phone: [facility]    Phone: [phone]  [facility]    Phone: [place]    Phone: [phone]  [place] [facility]  Phone: [place]               Phone: [phone]  [facility] [place]   Phone: [phone]  [facility]    Phone: [phone]  Safe Medications in Pregnancy    Acne: Benzoyl Peroxide Salicylic Acid  Backache/Headache: Tylenol: 2 regular strength every 4 hours OR              2 Extra strength every 6 hours  Colds/Coughs/Allergies: Benadryl (alcohol free) 25 mg every 6 hours as needed Breath right strips Claritin Cepacol throat lozenges Chloraseptic throat spray Cold-Eeze- up to three times per day Cough drops, alcohol free Flonase (by prescription only) Guaifenesin Mucinex Robitussin DM (plain only, alcohol free) Saline nasal spray/drops Sudafed (pseudoephedrine) & Actifed ** use only after [redacted] weeks gestation and if you do not have high blood pressure Tylenol Vicks Vaporub Zinc lozenges Zyrtec   Constipation: Colace Ducolax suppositories Fleet enema Glycerin suppositories Metamucil Milk of magnesia Miralax Senokot Smooth move tea  Diarrhea: Kaopectate Imodium A-D  *NO pepto  Bismol  Hemorrhoids: Anusol Anusol HC Preparation H Tucks  Indigestion: Tums Maalox Mylanta Zantac  Pepcid  Insomnia: Benadryl (alcohol free) 25mg  every 6 hours as needed Tylenol PM Unisom, no Gelcaps  Leg Cramps: Tums MagGel  Nausea/Vomiting:  Bonine Dramamine Emetrol Ginger extract Sea bands Meclizine  Nausea medication to take during pregnancy:  Unisom (doxylamine succinate 25 mg tablets) Take one tablet daily at bedtime. If symptoms are not adequately controlled, the dose can be increased to a maximum recommended dose of two tablets daily (1/2 tablet in the morning, 1/2 tablet mid-afternoon and one at bedtime). Vitamin B6 100mg  tablets. Take one tablet twice a day (up to 200 mg per day).  Skin Rashes: Aveeno products Benadryl cream or 25mg  every 6 hours as needed Calamine Lotion 1% cortisone cream  Yeast infection: Gyne-lotrimin 7 Monistat 7   **If taking multiple medications, please check labels to avoid duplicating the same active ingredients **take medication as directed on the label ** Do not exceed 4000 mg of tylenol in 24 hours **Do not take medications that contain aspirin or ibuprofen

## 2018-08-07 NOTE — MAU Note (Signed)
Upon further questioning, stool is very soft, but formed, not watery.

## 2018-08-07 NOTE — MAU Note (Signed)
Patient states she started to have diarrhea last night around 2230.  She described the stools as loose and mucous like at the end.  She has lower abdominal cramping with the bowel movements.  She has had 5 BM's the last was 7:30 a.m.  She also reports decreased fetal movement.

## 2018-08-07 NOTE — Progress Notes (Signed)
Discharge instructions reviewed with patient with the use of the interpreter.  We discussed PTL and other symptoms that she would need to return for.  She stated understanding.

## 2018-08-08 ENCOUNTER — Other Ambulatory Visit: Payer: Self-pay

## 2018-08-08 ENCOUNTER — Ambulatory Visit (INDEPENDENT_AMBULATORY_CARE_PROVIDER_SITE_OTHER): Payer: Medicaid Other | Admitting: *Deleted

## 2018-08-08 DIAGNOSIS — O0990 Supervision of high risk pregnancy, unspecified, unspecified trimester: Secondary | ICD-10-CM

## 2018-08-08 DIAGNOSIS — R7611 Nonspecific reaction to tuberculin skin test without active tuberculosis: Secondary | ICD-10-CM

## 2018-08-08 DIAGNOSIS — Z3490 Encounter for supervision of normal pregnancy, unspecified, unspecified trimester: Secondary | ICD-10-CM

## 2018-08-08 LAB — CULTURE, OB URINE: Culture: <10000 — AB

## 2018-08-08 MED ORDER — AMBULATORY NON FORMULARY MEDICATION: 1.0000 | 0 refills | Status: DC

## 2018-08-08 NOTE — Progress Notes (Signed)
I connected with  Cecil Cranker on 08/08/18 at  1:15 PM EDT by telephone and/ or virtually and verified that I am speaking with the correct person using two identifiers.   I discussed the limitations, risks, security and privacy concerns of performing an evaluation and management service by telephone and the availability of in person appointments. I also discussed with the patient that there may be a patient responsible charge related to this service. The patient expressed understanding and agreed to proceed.  I We assisted her with downloading webex and starting visit. We confirmed her EDD . Patient teary at times , states she worries about no family here except her children- worries about food , transportation, etc. [facility]- which she accepted. Scheduled [place] for same day as new ob appt due to transportation issues. Explained we will send an bp cuff to her for her to check her bp weekly if she thinks she can do this. She agreed with plan. Instructed her to bring to new ob appt if she doesn't understand how to use it.  I reveiwed our address/ her appoiintment day/time for her new ob appointment. Explained she will have new ob lab work then and genetic testing if desired. Explained we will do fasting cbg if she is fasting.  She voices understanding.   Dashel Goines,RN 08/08/2018  1:09 PM

## 2018-08-08 NOTE — Progress Notes (Signed)
Patient seen and assessed by nursing staff.  Agree with documentation and plan.  

## 2018-08-22 ENCOUNTER — Telehealth: Payer: Self-pay | Admitting: Nurse Practitioner

## 2018-08-22 ENCOUNTER — Telehealth: Payer: Self-pay | Admitting: Obstetrics & Gynecology

## 2018-08-22 NOTE — Telephone Encounter (Signed)
Called the patient to complete the pre-screen. The patient answered no to COVID19 symptoms and/or being previously diagnosed. Informed the patient of the wearing a face mask, sanitizing hands at the sanitizing station upon entering our office, and no visitors or children are allowed due to the COVID19 restrictions. The patient verbalized understanding. °

## 2018-08-22 NOTE — Telephone Encounter (Signed)
Called the patient with interpreter id [phone]

## 2018-08-23 ENCOUNTER — Encounter: Payer: Self-pay | Admitting: Family Medicine

## 2018-08-23 ENCOUNTER — Ambulatory Visit (HOSPITAL_COMMUNITY): Payer: Medicaid Other

## 2018-08-23 ENCOUNTER — Ambulatory Visit (INDEPENDENT_AMBULATORY_CARE_PROVIDER_SITE_OTHER): Payer: Medicaid Other | Admitting: Family Medicine

## 2018-08-23 ENCOUNTER — Other Ambulatory Visit: Payer: Self-pay

## 2018-08-23 VITALS — BP 96/52 | HR 80 | Wt 162.0 lb

## 2018-08-23 DIAGNOSIS — Z3492 Encounter for supervision of normal pregnancy, unspecified, second trimester: Secondary | ICD-10-CM

## 2018-08-23 DIAGNOSIS — Z3A25 25 weeks gestation of pregnancy: Secondary | ICD-10-CM

## 2018-08-23 MED ORDER — PRENATAL 27-0.8 MG PO TABS: 1.0000 | ORAL_TABLET | Freq: Every day | ORAL | 3 refills | Status: DC

## 2018-08-23 NOTE — Progress Notes (Signed)
Patient brought her blood pressure cuff from home with her and I taught patient how to check her blood pressure properly and when to report elevated blood pressures. Patient verbalized understanding & states she feels comfortable doing this.

## 2018-08-23 NOTE — Patient Instructions (Signed)

## 2018-08-23 NOTE — Progress Notes (Signed)
    PRENATAL VISIT NOTE  Subjective:  Jessica Bender is a 33 y.o. [phone] at [redacted]w[redacted]d being seen today for transferring prenatal care.  Previously seen in [place] at [facility] office. Wants to transfer to [place] due to [place] providing interpreter services. She has to provide her own at that office. She has limited family help and has poor transportation. She lives in [place]. She is currently monitored for the following issues for this low-risk pregnancy and has Language barrier; Positive TB test; Unilateral deafness, right; and Supervision of low-risk pregnancy on their problem list.  Patient reports no complaints.  Contractions: Not present. Vag. Bleeding: None.  Movement: Present. Denies leaking of fluid.   The following portions of the patient's history were reviewed and updated as appropriate: allergies, current medications, past family history, past medical history, past social history, past surgical history and problem list.   Objective:   Vitals:   08/23/18 0857  BP: (!) 96/52  Pulse: 80  Weight: 162 lb (73.5 kg)    Fetal Status: Fetal Heart Rate (bpm): 138   Movement: Present     General:  Alert, oriented and cooperative. Patient is in no acute distress.  Skin: Skin is warm and dry. No rash noted.   Cardiovascular: Normal heart rate noted  Respiratory: Normal respiratory effort, no problems with respiration noted  Abdomen: Soft, gravid, appropriate for gestational age.  Pain/Pressure: Absent     Pelvic: Cervical exam deferred        Extremities: Normal range of motion.  Edema: None  Mental Status: Normal mood and affect. Normal behavior. Normal judgment and thought content.   Assessment and Plan:  Pregnancy: [id number] at [redacted]w[redacted]d 1. Encounter for supervision of low-risk pregnancy in second trimester Continue routine prenatal care. Get labs and u/s from prenatal provider May change to location closer to home, [facility]  Preterm labor symptoms and general obstetric precautions including but  not limited to vaginal bleeding, contractions, leaking of fluid and fetal movement were reviewed in detail with the patient. Please refer to After Visit Summary for other counseling recommendations.   Return in 3 weeks (on 09/13/2018) for in person, 28 wk labs w GTT & tdap may schedule at [facility] lives there.  Future Appointments  Date Time Provider [place]  09/17/2018  8:15 AM Lavonia Drafts, MD [facility] None    Donnamae Jude, MD

## 2018-08-28 ENCOUNTER — Encounter: Payer: Self-pay | Admitting: General Practice

## 2018-08-28 DIAGNOSIS — Z3492 Encounter for supervision of normal pregnancy, unspecified, second trimester: Secondary | ICD-10-CM

## 2018-09-17 ENCOUNTER — Other Ambulatory Visit: Payer: Self-pay

## 2018-09-17 ENCOUNTER — Ambulatory Visit (INDEPENDENT_AMBULATORY_CARE_PROVIDER_SITE_OTHER): Payer: Medicaid Other | Admitting: Obstetrics & Gynecology

## 2018-09-17 VITALS — BP 97/63 | HR 82 | Wt 166.1 lb

## 2018-09-17 DIAGNOSIS — O368131 Decreased fetal movements, third trimester, fetus 1: Secondary | ICD-10-CM

## 2018-09-17 DIAGNOSIS — Z3A29 29 weeks gestation of pregnancy: Secondary | ICD-10-CM

## 2018-09-17 DIAGNOSIS — Z789 Other specified health status: Secondary | ICD-10-CM

## 2018-09-17 DIAGNOSIS — R7611 Nonspecific reaction to tuberculin skin test without active tuberculosis: Secondary | ICD-10-CM

## 2018-09-17 DIAGNOSIS — O26893 Other specified pregnancy related conditions, third trimester: Secondary | ICD-10-CM

## 2018-09-17 DIAGNOSIS — Z3490 Encounter for supervision of normal pregnancy, unspecified, unspecified trimester: Secondary | ICD-10-CM

## 2018-09-17 DIAGNOSIS — H9191 Unspecified hearing loss, right ear: Secondary | ICD-10-CM

## 2018-09-17 NOTE — Progress Notes (Signed)
   PRENATAL VISIT NOTE  Subjective:  Jessica Bender is a 33 y.o. [phone] at [redacted]w[redacted]d being seen today for ongoing prenatal care.  She is currently monitored for the following issues for this high-risk pregnancy and has Language barrier; Positive TB test; Unilateral deafness, right; and Supervision of low-risk pregnancy on their problem list.  Patient reports decreased fetal movement. .  Contractions: Not present. Vag. Bleeding: None.  Movement: (!) Decreased. Denies leaking of fluid.   The following portions of the patient's history were reviewed and updated as appropriate: allergies, current medications, past family history, past medical history, past social history, past surgical history and problem list.   Objective:   Vitals:   09/17/18 0835  BP: 97/63  Pulse: 82  Weight: 166 lb 1.9 oz (75.4 kg)    Fetal Status: Fetal Heart Rate (bpm): 131   Movement: (!) Decreased     General:  Alert, oriented and cooperative. Patient is in no acute distress.  Skin: Skin is warm and dry. No rash noted.   Cardiovascular: Normal heart rate noted  Respiratory: Normal respiratory effort, no problems with respiration noted  Abdomen: Soft, gravid, appropriate for gestational age.  Pain/Pressure: Absent     Pelvic: Cervical exam deferred        Extremities: Normal range of motion.  Edema: None  Mental Status: Normal mood and affect. Normal behavior. Normal judgment and thought content.   Assessment and Plan:  Pregnancy: [id number] at [redacted]w[redacted]d 1. Encounter for supervision of low-risk pregnancy, antepartum Decreased FM NST today- appropriate for GA   Pts care was transferred to [facility] since pt lived out here but, pt moved yesterday and now lives in  [place] and wants care transferred to the closest location.   2 hour GTT, HIV, RPR and CBC today    2. Positive TB test No active infection  3. Language barrier Interpreter present  4. Unilateral deafness, right   Preterm labor symptoms and general  obstetric precautions including but not limited to vaginal bleeding, contractions, leaking of fluid and fetal movement were reviewed in detail with the patient. Please refer to After Visit Summary for other counseling recommendations.   Return in about 4 weeks (around 10/15/2018) for in person.  No future appointments.  Lavonia Drafts, MD

## 2018-09-17 NOTE — Progress Notes (Signed)
Interpreter Lemmie Evens

## 2018-09-18 LAB — CBC
Hematocrit: 36.7 % (ref 34.0–46.6)
Hemoglobin: 12.8 g/dL (ref 11.1–15.9)
MCH: 32.6 pg (ref 26.6–33.0)
MCHC: 34.9 g/dL (ref 31.5–35.7)
MCV: 93 fL (ref 79–97)
Platelets: 237 10*3/uL (ref 150–450)
RBC: 3.93 x10E6/uL (ref 3.77–5.28)
RDW: 12.5 % (ref 11.7–15.4)
WBC: 6.7 10*3/uL (ref 3.4–10.8)

## 2018-09-18 LAB — HIV ANTIBODY (ROUTINE TESTING W REFLEX): HIV Screen 4th Generation wRfx: NONREACTIVE

## 2018-09-18 LAB — GLUCOSE TOLERANCE, 2 HOURS W/ 1HR
Glucose, 1 hour: 122 mg/dL (ref 65–179)
Glucose, 2 hour: 101 mg/dL (ref 65–152)
Glucose, Fasting: 66 mg/dL (ref 65–91)

## 2018-09-18 LAB — RPR: RPR Ser Ql: NONREACTIVE

## 2018-10-15 ENCOUNTER — Telehealth: Payer: Self-pay | Admitting: Women's Health

## 2018-10-15 NOTE — Telephone Encounter (Signed)
Attempted to call patient w/ [place] interpreter ID# [phone] about her appointment on 9/1 @ 10:55. No answer, interpreter left voicemail instructing patient to wear a face mask for the entire appointment and no visitors are allowed during the visit. Patient instructed not to attend the appointment if she was any symptoms. Symptom list and office number left.

## 2018-10-16 ENCOUNTER — Other Ambulatory Visit: Payer: Self-pay

## 2018-10-16 ENCOUNTER — Encounter: Payer: Medicaid Other | Admitting: Medical

## 2018-10-16 ENCOUNTER — Ambulatory Visit (INDEPENDENT_AMBULATORY_CARE_PROVIDER_SITE_OTHER): Payer: Medicaid Other | Admitting: Women's Health

## 2018-10-16 VITALS — BP 95/49 | HR 80 | Wt 172.0 lb

## 2018-10-16 DIAGNOSIS — O26893 Other specified pregnancy related conditions, third trimester: Secondary | ICD-10-CM

## 2018-10-16 DIAGNOSIS — Z3A33 33 weeks gestation of pregnancy: Secondary | ICD-10-CM

## 2018-10-16 DIAGNOSIS — O43193 Other malformation of placenta, third trimester: Secondary | ICD-10-CM

## 2018-10-16 DIAGNOSIS — Z3493 Encounter for supervision of normal pregnancy, unspecified, third trimester: Secondary | ICD-10-CM

## 2018-10-16 DIAGNOSIS — R7611 Nonspecific reaction to tuberculin skin test without active tuberculosis: Secondary | ICD-10-CM

## 2018-10-16 NOTE — Progress Notes (Signed)
Subjective:  Jessica Bender is a 33 y.o. [phone] at [redacted]w[redacted]d being seen today for ongoing prenatal care.  She is currently monitored for the following issues for this low-risk pregnancy and has Language barrier; Positive TB test; Unilateral deafness, right; Supervision of low-risk pregnancy; and Marginal insertion of umbilical cord affecting management of mother in third trimester on their problem list.  Pt has Positive TB test on her list, and states that she was partially treated, but she did not complete the treatment and was not followed to completion. Pt reports the medicine was "too strong for her" so she did not take it. Per Dr. Ilda Basset, will call the health department to request recommendations for treatment and/or monitoring during pregnancy. Pt denies any symptoms such as cough, bloody sputum, fevers, night sweats, weight loss.  This patient's sticky note states that she had a low lying placenta that resolved, but I cannot find an [place] report that states this. Pt does have a marginal cord insertion per [place] on 06/12/2018, problem added to list.  Pt had early anatomy scan on 06/12/2018, but anatomy could not be completed due to early GA. Pt reports she had a f/u US scheduled, but cancelled the appointment and never rescheduled. Will schedule today.  Patient reports no complaints.  Contractions: Not present. Vag. Bleeding: None.  Movement: Present. Denies leaking of fluid.   The following portions of the patient's history were reviewed and updated as appropriate: allergies, current medications, past family history, past medical history, past social history, past surgical history and problem list. Problem list updated.  Objective:   Vitals:   10/16/18 1102  BP: (!) 95/49  Pulse: 80  Weight: 172 lb (78 kg)    Fetal Status: Fetal Heart Rate (bpm): 148 Fundal Height: 33 cm Movement: Present     General:  Alert, oriented and cooperative. Patient is in no acute distress.  Skin: Skin is  warm and dry. No rash noted.   Cardiovascular: Normal heart rate noted  Respiratory: Normal respiratory effort, no problems with respiration noted  Abdomen: Soft, gravid, appropriate for gestational age. Pain/Pressure: Absent     Pelvic: Vag. Bleeding: None     Cervical exam deferred        Extremities: Normal range of motion.  Edema: None  Mental Status: Normal mood and affect. Normal behavior. Normal judgment and thought content.   Assessment and Plan:  Pregnancy: [id number] at [redacted]w[redacted]d  1. Encounter for supervision of low-risk pregnancy in third trimester -pt considering Nexplanon, information given -pt given list of pediatricians in [place] area, pt still undecided about pediatric provider  2. Positive TB test -will call health department to inquire about monitoring and/or treatment during pregnancy/postpartum   There are no diagnoses linked to this encounter. Preterm labor symptoms and general obstetric precautions including but not limited to vaginal bleeding, contractions, leaking of fluid and fetal movement were reviewed in detail with the patient. Please refer to After Visit Summary for other counseling recommendations.  Return in about 2 weeks (around 10/30/2018) for in-person, 2-3 weeks, GBS/GC/CT, ALSO needs f/u anatomy scan ASAP.   [name], NP 10/17/2018 6:58 AM

## 2018-10-16 NOTE — Patient Instructions (Addendum)
Contraceptive Implant Information A contraceptive implant is a small, plastic rod that is inserted under the skin. The implant releases a hormone into the bloodstream that prevents pregnancy. Contraceptive implants can be effective for up to 3 years. They do not provide protection against STIs (sexually transmitted infections). How does the implant work? Contraceptive implants prevent pregnancy by releasing a small amount of progestin into the bloodstream. Progestin has similar effects to the hormone progesterone, which plays a role in menstrual periods and pregnancy. Progestin will:  Stop the ovaries from releasing eggs.  Thicken cervical mucus to prevent sperm from entering the cervix.  Thin out the lining of the uterus to prevent a fertilized egg from attaching to the wall of the uterus. What are the advantages of this form of birth control? The advantages of this form of birth control include the following:  It is very effective at preventing pregnancy.  It is effective for up to 3 years.  It can easily be removed.  It does not interfere with sex or daily activities.  It can be used when breastfeeding.  It can be used by women who cannot take estrogen.  The procedure to insert the device is quick.  Women can get pregnant shortly after removing the device. What are the disadvantages of this form of birth control? The disadvantages of this form of birth control include the following:  It can cause side effects, including: ? Irregular menstrual periods or bleeding. ? Headache. ? Weight gain. ? Acne. ? Breast tenderness. ? Abdomen (abdominal) pain. ? Mood changes, such as depression.  It does not protect against STIs.  You must make an office visit to have it inserted and removed by a trained clinician.  Inserting or removing the device can result in pain, scarring, and tissue or nerve damage (rare). How is this implant inserted? The procedure to insert an implant only  takes a few minutes. During the procedure:  Your upper arm will be numbed with a numbing medicine (local anesthetic).  The implant will be injected under the skin of your upper arm with a needle. After the procedure:  You may experience minor bruising, swelling, or discomfort at the insertion site. This should only last for a couple of days.  You may need to use another, non-hormonal contraceptive such as a condom for 7 days after the procedure. How is the implant removed? The implant should be removed after 3 years or as directed by your health care provider. The procedure to remove the implant only takes a few minutes. During this procedure:  Your upper arm will be numbed with a local anesthetic.  A small incision will be made near the implant.  The implant will be removed with a small pair of forceps. After the implant is removed:  The effect of the implant will wear off a few hours after removal. Most women will be able to get pregnant within 3 weeks of removal.  A new implant can be inserted as soon as the old one is removed, if desired.  You may experience minor bruising, swelling, or discomfort at the removal site. This should only last for a couple of days. Is this implant right for me? Your health care provider can help you determine whether you are good candidate for a contraceptive implant. Make sure to discuss the possible side effects with your health care provider. You should not get the implant if you:  Are pregnant.  Are allergic to any part of the implant.  Have a history of: ? Breast cancer. ? Unusual bleeding from the vagina. ? Heart disease. ? Stroke. ? Liver disease or tumors. ? Migraines. Summary  A contraceptive implant is a small, plastic rod that is inserted under the skin. The implant releases a hormone into the bloodstream that prevents pregnancy.  Contraceptive implants can be effective for up to 3 years.  The implant works by preventing  ovaries from releasing eggs, thickening the cervical mucus, and thinning the uterine wall.  This form of birth control is very effective at preventing pregnancy and can be inserted and removed quickly. Women can get pregnant shortly after the device is removed.  This form of birth control can cause some side effects, including weight gain, breast tenderness, headaches, irregular periods or bleeding, acne, abdominal pain, and depression. It does not provide protection against STIs (sexually transmitted infections). This information is not intended to replace advice given to you by your health care provider. Make sure you discuss any questions you have with your health care provider. Document Released: 01/20/2011 Document Revised: 03/27/2018 Document Reviewed: 01/16/2016 Elsevier Patient Education  [place] [facility] PH[facility]IC[facility]  [facility] [place] [address] [place], [place] [address] Phone - [phone]   Fax - [employer] [address], [place]  [address] Phone - [phone]   Fax - [phone]  [place]. [address] [place] [address] [place], [place]  [address] Phone - [phone]   Fax - [phone]  [facility] [address] [place], [place]  [address] Phone - [phone]   Fax - [phone]  [place] [address] [place], [place]  [address] Phone - [phone]   Fax - [place] [address] [place], [place]  [address] Phone - [phone]   Fax - [place] [facility] [place] [address], [place], [place]  [address] Phone - [phone]   Fax - [phone]  [place] [address] [place], [place], [place]  [address] Phone - [phone]   Fax - [place] [address] [place], [place]  [address] Phone - [phone]   Fax - [phone] [facility] [place]. [address] [place], [place]  [address] Phone - [phone]   Fax - [phone]  [place] [address] [place], [place]  [address] Phone - [phone]   Fax - [phone]  [facility] [place], [place], [place]  [address] Phone - [phone]   Fax - [place] [address] [place], [place]  [address] Phone - [phone]   Fax - [phone]  [facility] [address] [place], [place]  [address] Phone - [place], [place]  [address] Phone - [phone]   Fax - [place] [address] [place], [place]  [address] Phone - [phone]   Fax - [phone]  [place] [address] [place], [place]  [address] Phone - [phone]   Fax - [place]. [place], [place]  [address] Phone - [phone]   Fax - [place], [place], [place]  [address] Phone - [phone]   Fax - [phone]  [facility] [address] [place], [place]  [address] Phone - [phone]   Fax - [phone]  DAVID RUBIN [address] [place], [place]  [address] Phone - [phone]   Fax - [phone]  [facility] [address] [place], [place]  [address] Phone - [phone]   Fax - [phone]  [place] [facility] [place] [address] [place], [place]  [address] Phone - [phone]   Fax - [phone] [facility] [place] [address] [place], [place]  [address] Phone - [phone]   Fax - [phone]  [facility] [place] [address] [place], [place]   [address] Phone - [phone]   Fax - [phone]  [facility] [place] [address] [place], [place]  [address] Phone - [phone]   Fax - [phone]    Preterm Labor and Birth Information  The normal length of a pregnancy is 39-41 weeks. Preterm labor is when labor starts before 37 completed weeks of pregnancy. What are the risk factors for preterm labor? Preterm labor is more likely to occur in women who:  Have certain infections during pregnancy such as a bladder infection, sexually transmitted infection, or infection inside the uterus (chorioamnionitis).  Have a shorter-than-normal cervix.  Have gone into preterm labor before.  Have had surgery on their cervix.  Are younger than age 34 or older than age 34.  Are African American.  Are pregnant with twins or multiple babies (multiple gestation).  Take street drugs or smoke while pregnant.  Do not gain enough weight while pregnant.  Became pregnant shortly after having been pregnant. What are the symptoms of preterm labor? Symptoms of preterm labor include:  Cramps similar to those that can happen during a menstrual period. The cramps may happen with diarrhea.  Pain in the abdomen or lower back.  Regular uterine contractions that may feel like tightening of the abdomen.  A feeling of increased pressure in the pelvis.  Increased watery or bloody mucus discharge from the vagina.  Water breaking (ruptured amniotic sac). Why is it important to recognize signs of preterm labor? It is important to recognize signs of preterm labor because babies who are born prematurely may not be fully developed. This can put them at an increased risk for:  Long-term (chronic) heart and lung problems.  Difficulty immediately after birth with regulating body systems, including blood sugar, body temperature, heart rate, and breathing rate.  Bleeding in the brain.  Cerebral palsy.  Learning  difficulties.  Death. These risks are highest for babies who are born before 34 weeks of pregnancy. How is preterm labor treated? Treatment depends on the length of your pregnancy, your condition, and the health of your baby. It may involve:  Having a stitch (suture) placed in your cervix to prevent your cervix from opening too early (cerclage).  Taking or being given medicines, such as: ? Hormone medicines. These may be given early in pregnancy to help support the pregnancy. ? Medicine to stop contractions. ? Medicines to help mature the baby's lungs. These may be prescribed if the risk of delivery is high. ? Medicines to prevent your baby from developing cerebral palsy. If the labor happens before 34 weeks of pregnancy, you may need to stay in the hospital. What should I do if I think I am in preterm labor? If you think that you are going into  preterm labor, call your health care provider right away. How can I prevent preterm labor in future pregnancies? To increase your chance of having a full-term pregnancy:  Do not use any tobacco products, such as cigarettes, chewing tobacco, and e-cigarettes. If you need help quitting, ask your health care provider.  Do not use street drugs or medicines that have not been prescribed to you during your pregnancy.  Talk with your health care provider before taking any herbal supplements, even if you have been taking them regularly.  Make sure you gain a healthy amount of weight during your pregnancy.  Watch for infection. If you think that you might have an infection, get it checked right away.  Make sure to tell your health care provider if you have gone into preterm labor before. This information is not intended to replace advice given to you by your health care provider. Make sure you discuss any questions you have with your health care provider. Document Released: 04/23/2003 Document Revised: 05/25/2018 Document Reviewed: 06/24/2015 Elsevier  Patient Education  2020 [employer].

## 2018-10-17 DIAGNOSIS — O43193 Other malformation of placenta, third trimester: Secondary | ICD-10-CM | POA: Insufficient documentation

## 2018-10-24 ENCOUNTER — Telehealth: Payer: Self-pay | Admitting: Women's Health

## 2018-10-24 NOTE — Telephone Encounter (Signed)
Attempted to call [facility] re: completion of TB treatment. Called [phone]. No answer, left voicemail.  [name], NP  11:24 AM 10/24/2018

## 2018-10-26 ENCOUNTER — Ambulatory Visit (HOSPITAL_COMMUNITY): Payer: Medicaid Other

## 2018-11-05 ENCOUNTER — Encounter (HOSPITAL_COMMUNITY): Payer: Self-pay | Admitting: *Deleted

## 2018-11-05 ENCOUNTER — Other Ambulatory Visit: Payer: Self-pay | Admitting: Advanced Practice Midwife

## 2018-11-05 ENCOUNTER — Other Ambulatory Visit: Payer: Self-pay | Admitting: Women's Health

## 2018-11-05 ENCOUNTER — Ambulatory Visit (HOSPITAL_COMMUNITY): Payer: Medicaid Other | Admitting: *Deleted

## 2018-11-05 ENCOUNTER — Ambulatory Visit (HOSPITAL_COMMUNITY)
Admission: RE | Admit: 2018-11-05 | Discharge: 2018-11-05 | Disposition: A | Discharge status: Home / Self Care | Payer: Medicaid Other | Source: Ambulatory Visit | Attending: Women's Health | Admitting: Women's Health

## 2018-11-05 ENCOUNTER — Other Ambulatory Visit: Payer: Self-pay

## 2018-11-05 ENCOUNTER — Encounter: Payer: Self-pay | Admitting: Advanced Practice Midwife

## 2018-11-05 ENCOUNTER — Other Ambulatory Visit (HOSPITAL_COMMUNITY)
Admission: RE | Admit: 2018-11-05 | Discharge: 2018-11-05 | Disposition: A | Discharge status: Home / Self Care | Payer: Medicaid Other | Source: Ambulatory Visit | Attending: Advanced Practice Midwife | Admitting: Advanced Practice Midwife

## 2018-11-05 ENCOUNTER — Ambulatory Visit (INDEPENDENT_AMBULATORY_CARE_PROVIDER_SITE_OTHER): Payer: Medicaid Other | Admitting: Advanced Practice Midwife

## 2018-11-05 VITALS — BP 99/63 | HR 80 | Wt 177.0 lb

## 2018-11-05 DIAGNOSIS — Z362 Encounter for other antenatal screening follow-up: Secondary | ICD-10-CM

## 2018-11-05 DIAGNOSIS — Z3403 Encounter for supervision of normal first pregnancy, third trimester: Secondary | ICD-10-CM

## 2018-11-05 DIAGNOSIS — O43193 Other malformation of placenta, third trimester: Secondary | ICD-10-CM | POA: Diagnosis present

## 2018-11-05 DIAGNOSIS — Z3493 Encounter for supervision of normal pregnancy, unspecified, third trimester: Secondary | ICD-10-CM | POA: Diagnosis present

## 2018-11-05 DIAGNOSIS — Z3A36 36 weeks gestation of pregnancy: Secondary | ICD-10-CM | POA: Diagnosis not present

## 2018-11-05 LAB — OB RESULTS CONSOLE GBS: GBS: NEGATIVE

## 2018-11-05 NOTE — Progress Notes (Signed)
   PRENATAL VISIT NOTE  Subjective:  Jessica Bender is a 33 y.o. [phone] at [redacted]w[redacted]d being seen today for ongoing prenatal care.  She is currently monitored for the following issues for this low-risk pregnancy and has Language barrier; Positive TB test; Unilateral deafness, right; Supervision of low-risk pregnancy; and Marginal insertion of umbilical cord affecting management of mother in third trimester on their problem list.  Patient reports no complaints.  Contractions: Irritability. Vag. Bleeding: None.  Movement: Present. Denies leaking of fluid.   The following portions of the patient's history were reviewed and updated as appropriate: allergies, current medications, past family history, past medical history, past social history, past surgical history and problem list.   Objective:   Vitals:   11/05/18 1601  BP: 99/63  Pulse: 80  Weight: 177 lb (80.3 kg)    Fetal Status: Fetal Heart Rate (bpm): 150 Fundal Height: 36 cm Movement: Present     General:  Alert, oriented and cooperative. Patient is in no acute distress.  Skin: Skin is warm and dry. No rash noted.   Cardiovascular: Normal heart rate noted  Respiratory: Normal respiratory effort, no problems with respiration noted  Abdomen: Soft, gravid, appropriate for gestational age.  Pain/Pressure: Present     Pelvic: Cervical exam performed Dilation: Closed Effacement (%): Thick Station: -3  Extremities: Normal range of motion.  Edema: None  Mental Status: Normal mood and affect. Normal behavior. Normal judgment and thought content.   Assessment and Plan:  Pregnancy: [id number] at [redacted]w[redacted]d 1. Encounter for supervision of normal first pregnancy in third trimester - routine care - Interpretor used for visit  - Growth [place] today, results pending  - Culture, beta strep (group b only) - Cervicovaginal ancillary only( [place])  Term labor symptoms and general obstetric precautions including but not limited to vaginal bleeding,  contractions, leaking of fluid and fetal movement were reviewed in detail with the patient. Please refer to After Visit Summary for other counseling recommendations.   Return in about 1 week (around 11/12/2018).  No future appointments.  Marcille Buffy DNP, CNM  11/05/18  4:36 PM

## 2018-11-07 LAB — CERVICOVAGINAL ANCILLARY ONLY
Chlamydia: NEGATIVE
Neisseria Gonorrhea: NEGATIVE

## 2018-11-08 LAB — CULTURE, BETA STREP (GROUP B ONLY): Strep Gp B Culture: NEGATIVE

## 2018-11-09 ENCOUNTER — Telehealth: Payer: Self-pay | Admitting: Nurse Practitioner

## 2018-11-09 NOTE — Telephone Encounter (Signed)
Attempted to call patient w/ [place] interpreter ID# [phone] about her appointment on 9/28 @ 8:35. No answer, interpreter left voicemail instructing patient to wear a face mask for the entire appointment and no visitors are allowed during the visit. Patient instructed not to attend the appointment if she was any symptoms. Symptom list and office number left.

## 2018-11-12 ENCOUNTER — Encounter: Payer: Self-pay | Admitting: Nurse Practitioner

## 2018-11-12 ENCOUNTER — Other Ambulatory Visit: Payer: Self-pay

## 2018-11-12 ENCOUNTER — Other Ambulatory Visit: Payer: Self-pay | Admitting: Nurse Practitioner

## 2018-11-12 ENCOUNTER — Ambulatory Visit (INDEPENDENT_AMBULATORY_CARE_PROVIDER_SITE_OTHER): Payer: Medicaid Other | Admitting: Nurse Practitioner

## 2018-11-12 ENCOUNTER — Ambulatory Visit
Admission: RE | Admit: 2018-11-12 | Discharge: 2018-11-12 | Disposition: A | Discharge status: Home / Self Care | Payer: Medicaid Other | Source: Ambulatory Visit | Attending: Nurse Practitioner | Admitting: Nurse Practitioner

## 2018-11-12 VITALS — BP 101/65 | HR 89 | Temp 97.9°F | Wt 180.9 lb

## 2018-11-12 DIAGNOSIS — R7611 Nonspecific reaction to tuberculin skin test without active tuberculosis: Secondary | ICD-10-CM

## 2018-11-12 DIAGNOSIS — O98013 Tuberculosis complicating pregnancy, third trimester: Secondary | ICD-10-CM

## 2018-11-12 DIAGNOSIS — Z3493 Encounter for supervision of normal pregnancy, unspecified, third trimester: Secondary | ICD-10-CM

## 2018-11-12 DIAGNOSIS — Z3A37 37 weeks gestation of pregnancy: Secondary | ICD-10-CM

## 2018-11-12 MED ORDER — VITAFOL ULTRA 29-0.6-0.4-200 MG PO CAPS: 1.0000 | ORAL_CAPSULE | Freq: Every day | ORAL | 11 refills | Status: AC

## 2018-11-12 MED ORDER — CALCIUM CARBONATE ANTACID 500 MG PO CHEW: 1.0000 | CHEWABLE_TABLET | Freq: Every day | ORAL | 0 refills | Status: DC

## 2018-11-12 MED ORDER — OMEPRAZOLE 40 MG PO CPDR: 40.0000 mg | DELAYED_RELEASE_CAPSULE | Freq: Every day | ORAL | 1 refills | Status: DC

## 2018-11-12 NOTE — Progress Notes (Addendum)
dg chest   Subjective:  Jessica Bender is a 33 y.o. [phone] at [redacted]w[redacted]d being seen today for ongoing prenatal care.  She is currently monitored for the following issues for this low-risk pregnancy and has Language barrier; Positive TB test; Unilateral deafness, right; Supervision of low-risk pregnancy; and Marginal insertion of umbilical cord affecting management of mother in third trimester on their problem list.  Interpreter present with client for the entire visit.  Patient reports heartburn.  Contractions: Not present. Vag. Bleeding: None.  Movement: Present. Denies leaking of fluid.   The following portions of the patient's history were reviewed and updated as appropriate: allergies, current medications, past family history, past medical history, past social history, past surgical history and problem list. Problem list updated.  Objective:   Vitals:   11/12/18 0851  BP: 101/65  Pulse: 89  Temp: 97.9 F (36.6 C)  Weight: 180 lb 14.4 oz (82.1 kg)    Fetal Status: Fetal Heart Rate (bpm): 144 Fundal Height: 38 cm Movement: Present     General:  Alert, oriented and cooperative. Patient is in no acute distress.  Skin: Skin is warm and dry. No rash noted.   Cardiovascular: Normal heart rate noted  Respiratory: Normal respiratory effort, no problems with respiration noted  Abdomen: Soft, gravid, appropriate for gestational age. Pain/Pressure: Absent     Pelvic:  Cervical exam deferred        Extremities: Normal range of motion.  Edema: None  Mental Status: Normal mood and affect. Normal behavior. Normal judgment and thought content.   Urinalysis:      Assessment and Plan:  Pregnancy: [id number] at [redacted]w[redacted]d  1. Positive TB test Reviewed [facility] info from [facility] added to problem list for positive TB test - info added on 11-08-18.  Signs of TB reviewed with client and she states no to fevers, night sweats, coughing, hemoptysis, weight loss, SOB, Chest pain.  She is having heartburn which is  normal in late pregnancy.  Will order chest xray and advised that it is important for her to have the xray before the baby is born.  Order printed for her to take to [facility] on [address] and walk in for the chest xray.  - DG Chest 2 View; Future  2. Encounter for supervision of low-risk pregnancy in third trimester Medication for heartburn ordered to her pharmacy Reviewed pharmacy and PNV sent again to her pharmacy.  Term labor symptoms and general obstetric precautions including but not limited to vaginal bleeding, contractions, leaking of fluid and fetal movement were reviewed in detail with the patient. Please refer to After Visit Summary for other counseling recommendations.  Return in about 1 week (around 11/19/2018).  Already has appointments scheduled on 10-5 and 10-15.  [name], RN, MSN, NP-BC Nurse Practitioner, [facility] for [employer], [place] [facility] 11/12/2018 9:28 AM

## 2018-11-19 ENCOUNTER — Ambulatory Visit (INDEPENDENT_AMBULATORY_CARE_PROVIDER_SITE_OTHER): Payer: Medicaid Other | Admitting: Student

## 2018-11-19 ENCOUNTER — Encounter: Payer: Self-pay | Admitting: Student

## 2018-11-19 ENCOUNTER — Other Ambulatory Visit: Payer: Self-pay

## 2018-11-19 VITALS — BP 100/62 | HR 92 | Temp 98.1°F | Wt 181.2 lb

## 2018-11-19 DIAGNOSIS — Z3A38 38 weeks gestation of pregnancy: Secondary | ICD-10-CM

## 2018-11-19 DIAGNOSIS — Z789 Other specified health status: Secondary | ICD-10-CM

## 2018-11-19 DIAGNOSIS — Z3493 Encounter for supervision of normal pregnancy, unspecified, third trimester: Secondary | ICD-10-CM

## 2018-11-19 NOTE — Progress Notes (Signed)
Pt is having difficulty falling asleep.And problem with breathing.

## 2018-11-19 NOTE — Progress Notes (Signed)
   PRENATAL VISIT NOTE  Subjective:  Jessica Bender is a 33 y.o. [phone] at [redacted]w[redacted]d being seen today for ongoing prenatal care.  She is currently monitored for the following issues for this low-risk pregnancy and has Language barrier; Positive TB test; Unilateral deafness, right; Supervision of low-risk pregnancy; and Marginal insertion of umbilical cord affecting management of mother in third trimester on their problem list.  Patient reports no complaints.  Contractions: Not present. Vag. Bleeding: None.  Movement: Present. Denies leaking of fluid.   The following portions of the patient's history were reviewed and updated as appropriate: allergies, current medications, past family history, past medical history, past social history, past surgical history and problem list.   Objective:   Vitals:   11/19/18 1550  BP: 100/62  Pulse: 92  Temp: 98.1 F (36.7 C)  Weight: 181 lb 3.2 oz (82.2 kg)    Fetal Status: Fetal Heart Rate (bpm): 144 Fundal Height: 37 cm Movement: Present     General:  Alert, oriented and cooperative. Patient is in no acute distress.  Skin: Skin is warm and dry. No rash noted.   Cardiovascular: Normal heart rate noted  Respiratory: Normal respiratory effort, no problems with respiration noted  Abdomen: Soft, gravid, appropriate for gestational age.  Pain/Pressure: Present     Pelvic: Cervical exam deferred        Extremities: Normal range of motion.  Edema: None  Mental Status: Normal mood and affect. Normal behavior. Normal judgment and thought content.   Assessment and Plan:  Pregnancy: [id number] at [redacted]w[redacted]d 1. Encounter for supervision of low-risk pregnancy in third trimester -doing well -declines cervical exam -GBS neg -reviewed location of [place]  2. Language barrier -[place] interpreter at bedside  Term labor symptoms and general obstetric precautions including but not limited to vaginal bleeding, contractions, leaking of fluid and fetal movement  were reviewed in detail with the patient. Please refer to After Visit Summary for other counseling recommendations.   No follow-ups on file.  Future Appointments  Date Time Provider [place]  11/26/2018  3:35 PM Jorje Guild, NP [facility]    Jorje Guild, NP

## 2018-11-19 NOTE — Patient Instructions (Addendum)
[facility] [address] Entrance C    Fetal Movement Counts Patient Name: ________________________________________________ Patient Due Date: ____________________ What is a fetal movement count?  A fetal movement count is the number of times that you feel your baby move during a certain amount of time. This may also be called a fetal kick count. A fetal movement count is recommended for every pregnant woman. You may be asked to start counting fetal movements as early as week 28 of your pregnancy. Pay attention to when your baby is most active. You may notice your baby's sleep and wake cycles. You may also notice things that make your baby move more. You should do a fetal movement count:  When your baby is normally most active.  At the same time each day. A good time to count movements is while you are resting, after having something to eat and drink. How do I count fetal movements? 1. Find a quiet, comfortable area. Sit, or lie down on your side. 2. Write down the date, the start time and stop time, and the number of movements that you felt between those two times. Take this information with you to your health care visits. 3. For 2 hours, count kicks, flutters, swishes, rolls, and jabs. You should feel at least 10 movements during 2 hours. 4. You may stop counting after you have felt 10 movements. 5. If you do not feel 10 movements in 2 hours, have something to eat and drink. Then, keep resting and counting for 1 hour. If you feel at least 4 movements during that hour, you may stop counting. Contact a health care provider if:  You feel fewer than 4 movements in 2 hours.  Your baby is not moving like he or she usually does. Date: ____________ Start time: ____________ Stop time: ____________ Movements: ____________ Date: ____________ Start time: ____________ Stop time: ____________ Movements: ____________ Date: ____________ Start time: ____________ Stop  time: ____________ Movements: ____________ Date: ____________ Start time: ____________ Stop time: ____________ Movements: ____________ Date: ____________ Start time: ____________ Stop time: ____________ Movements: ____________ Date: ____________ Start time: ____________ Stop time: ____________ Movements: ____________ Date: ____________ Start time: ____________ Stop time: ____________ Movements: ____________ Date: ____________ Start time: ____________ Stop time: ____________ Movements: ____________ Date: ____________ Start time: ____________ Stop time: ____________ Movements: ____________ This information is not intended to replace advice given to you by your health care provider. Make sure you discuss any questions you have with your health care provider. Document Released: 03/02/2006 Document Revised: 02/20/2018 Document Reviewed: 03/12/2015 Elsevier Patient Education  2020 [employer] Signs and Symptoms of Labor Labor is your body's natural process of moving your baby, placenta, and umbilical cord out of your uterus. The process of labor usually starts when your baby is full-term, between 5737 and 40 weeks of pregnancy. How will I know when I am close to going into labor? As your body prepares for labor and the birth of your baby, you may notice the following symptoms in the weeks and days before true labor starts:  Having a strong desire to get your home ready to receive your new baby. This is called nesting. Nesting may be a sign that labor is approaching, and it may occur several weeks before birth. Nesting may involve cleaning and organizing your home.  Passing a small amount of thick, bloody mucus out of your vagina (normal bloody show or losing your mucus plug). This may happen more than a week before labor begins,  or it might occur right before labor begins as the opening of the cervix starts to widen (dilate). For some women, the entire mucus plug passes at once. For others, smaller  portions of the mucus plug may gradually pass over several days.  Your baby moving (dropping) lower in your pelvis to get into position for birth (lightening). When this happens, you may feel more pressure on your bladder and pelvic bone and less pressure on your ribs. This may make it easier to breathe. It may also cause you to need to urinate more often and have problems with bowel movements.  Having "practice contractions" (Braxton Hicks contractions) that occur at irregular (unevenly spaced) intervals that are more than 10 minutes apart. This is also called false labor. False labor contractions are common after exercise or sexual activity, and they will stop if you change position, rest, or drink fluids. These contractions are usually mild and do not get stronger over time. They may feel like: ? A backache or back pain. ? Mild cramps, similar to menstrual cramps. ? Tightening or pressure in your abdomen. Other early symptoms that labor may be starting soon include:  Nausea or loss of appetite.  Diarrhea.  Having a sudden burst of energy, or feeling very tired.  Mood changes.  Having trouble sleeping. How will I know when labor has begun? Signs that true labor has begun may include:  Having contractions that come at regular (evenly spaced) intervals and increase in intensity. This may feel like more intense tightening or pressure in your abdomen that moves to your back. ? Contractions may also feel like rhythmic pain in your upper thighs or back that comes and goes at regular intervals. ? For first-time mothers, this change in intensity of contractions often occurs at a more gradual pace. ? Women who have given birth before may notice a more rapid progression of contraction changes.  Having a feeling of pressure in the vaginal area.  Your water breaking (rupture of membranes). This is when the sac of fluid that surrounds your baby breaks. When this happens, you will notice fluid  leaking from your vagina. This may be clear or blood-tinged. Labor usually starts within 24 hours of your water breaking, but it may take longer to begin. ? Some women notice this as a gush of fluid. ? Others notice that their underwear repeatedly becomes damp. Follow these instructions at home:   When labor starts, or if your water breaks, call your health care provider or nurse care line. Based on your situation, they will determine when you should go in for an exam.  When you are in early labor, you may be able to rest and manage symptoms at home. Some strategies to try at home include: ? Breathing and relaxation techniques. ? Taking a warm bath or shower. ? Listening to music. ? Using a heating pad on the lower back for pain. If you are directed to use heat:  Place a towel between your skin and the heat source.  Leave the heat on for 20-30 minutes.  Remove the heat if your skin turns bright red. This is especially important if you are unable to feel pain, heat, or cold. You may have a greater risk of getting burned. Get help right away if:  You have painful, regular contractions that are 5 minutes apart or less.  Labor starts before you are [redacted] weeks along in your pregnancy.  You have a fever.  You have a headache that does  not go away.  You have bright red blood coming from your vagina.  You do not feel your baby moving.  You have a sudden onset of: ? Severe headache with vision problems. ? Nausea, vomiting, or diarrhea. ? Chest pain or shortness of breath. These symptoms may be an emergency. If your health care provider recommends that you go to the hospital or birth center where you plan to deliver, do not drive yourself. Have someone else drive you, or call emergency services (911 in the [place] Summary  Labor is your body's natural process of moving your baby, placenta, and umbilical cord out of your uterus.  The process of labor usually starts when your baby is  full-term, between 74 and 40 weeks of pregnancy.  When labor starts, or if your water breaks, call your health care provider or nurse care line. Based on your situation, they will determine when you should go in for an exam. This information is not intended to replace advice given to you by your health care provider. Make sure you discuss any questions you have with your health care provider. Document Released: 07/08/2016 Document Revised: 10/31/2016 Document Reviewed: 07/08/2016 Elsevier Patient Education  2020 [employer].

## 2018-11-22 ENCOUNTER — Encounter (HOSPITAL_COMMUNITY): Payer: Self-pay | Admitting: *Deleted

## 2018-11-22 ENCOUNTER — Other Ambulatory Visit: Payer: Self-pay

## 2018-11-22 ENCOUNTER — Inpatient Hospital Stay (HOSPITAL_COMMUNITY)
Admission: EM | Admit: 2018-11-22 | Discharge: 2018-11-23 | DRG: 807 | Disposition: A | Discharge status: Home / Self Care | Payer: Medicaid Other | Attending: Family Medicine | Admitting: Family Medicine

## 2018-11-22 DIAGNOSIS — Z227 Latent tuberculosis: Secondary | ICD-10-CM | POA: Diagnosis not present

## 2018-11-22 DIAGNOSIS — Z20828 Contact with and (suspected) exposure to other viral communicable diseases: Secondary | ICD-10-CM | POA: Diagnosis present

## 2018-11-22 DIAGNOSIS — Z3A38 38 weeks gestation of pregnancy: Secondary | ICD-10-CM | POA: Diagnosis not present

## 2018-11-22 DIAGNOSIS — Z23 Encounter for immunization: Secondary | ICD-10-CM

## 2018-11-22 DIAGNOSIS — O26893 Other specified pregnancy related conditions, third trimester: Secondary | ICD-10-CM | POA: Diagnosis present

## 2018-11-22 DIAGNOSIS — O43193 Other malformation of placenta, third trimester: Secondary | ICD-10-CM

## 2018-11-22 DIAGNOSIS — O43123 Velamentous insertion of umbilical cord, third trimester: Secondary | ICD-10-CM | POA: Diagnosis present

## 2018-11-22 DIAGNOSIS — Z789 Other specified health status: Secondary | ICD-10-CM | POA: Diagnosis present

## 2018-11-22 DIAGNOSIS — Z3493 Encounter for supervision of normal pregnancy, unspecified, third trimester: Secondary | ICD-10-CM

## 2018-11-22 LAB — CBC
HCT: 40.8 % (ref 36.0–46.0)
Hemoglobin: 14.2 g/dL (ref 12.0–15.0)
MCH: 32.1 pg (ref 26.0–34.0)
MCHC: 34.8 g/dL (ref 30.0–36.0)
MCV: 92.1 fL (ref 80.0–100.0)
Platelets: 177 10*3/uL (ref 150–400)
RBC: 4.43 MIL/uL (ref 3.87–5.11)
RDW: 12.7 % (ref 11.5–15.5)
WBC: 9.2 10*3/uL (ref 4.0–10.5)
nRBC: 0 % (ref 0.0–0.2)

## 2018-11-22 LAB — SARS CORONAVIRUS 2 BY RT PCR (HOSPITAL ORDER, PERFORMED IN ~~LOC~~ HOSPITAL LAB): SARS Coronavirus 2: NEGATIVE

## 2018-11-22 LAB — TYPE AND SCREEN
ABO/RH(D): O POS
Antibody Screen: NEGATIVE

## 2018-11-22 MED ORDER — ACETAMINOPHEN 325 MG PO TABS
650.0000 mg | ORAL_TABLET | Freq: Four times a day (QID) | ORAL | Status: DC | PRN
  Administered 2018-11-23: 650 mg via ORAL
  Filled 2018-11-22: qty 2

## 2018-11-22 MED ORDER — LIDOCAINE HCL (PF) 1 % IJ SOLN: 30.0000 mL | INTRAMUSCULAR | Status: DC | PRN

## 2018-11-22 MED ORDER — SIMETHICONE 80 MG PO CHEW: 80.0000 mg | CHEWABLE_TABLET | ORAL | Status: DC | PRN

## 2018-11-22 MED ORDER — LACTATED RINGERS IV SOLN: 500.0000 mL | INTRAVENOUS | Status: DC | PRN

## 2018-11-22 MED ORDER — IBUPROFEN 600 MG PO TABS
600.0000 mg | ORAL_TABLET | Freq: Three times a day (TID) | ORAL | Status: DC | PRN
  Administered 2018-11-22 – 2018-11-23 (×3): 600 mg via ORAL
  Filled 2018-11-22 (×4): qty 1

## 2018-11-22 MED ORDER — ONDANSETRON HCL 4 MG/2ML IJ SOLN: 4.0000 mg | INTRAMUSCULAR | Status: DC | PRN

## 2018-11-22 MED ORDER — EPHEDRINE 5 MG/ML INJ: 10.0000 mg | INTRAVENOUS | Status: DC | PRN

## 2018-11-22 MED ORDER — SENNOSIDES-DOCUSATE SODIUM 8.6-50 MG PO TABS
2.0000 | ORAL_TABLET | ORAL | Status: DC
  Administered 2018-11-22: 2 via ORAL
  Filled 2018-11-22: qty 2

## 2018-11-22 MED ORDER — BENZOCAINE-MENTHOL 20-0.5 % EX AERO: 1.0000 "application " | INHALATION_SPRAY | CUTANEOUS | Status: DC | PRN

## 2018-11-22 MED ORDER — FENTANYL CITRATE (PF) 100 MCG/2ML IJ SOLN: 50.0000 ug | INTRAMUSCULAR | Status: DC | PRN

## 2018-11-22 MED ORDER — LACTATED RINGERS IV SOLN
INTRAVENOUS | Status: DC
  Administered 2018-11-22 (×2): via INTRAVENOUS

## 2018-11-22 MED ORDER — OXYCODONE-ACETAMINOPHEN 5-325 MG PO TABS
1.0000 | ORAL_TABLET | ORAL | Status: DC | PRN
  Administered 2018-11-22: 1 via ORAL
  Filled 2018-11-22 (×2): qty 1

## 2018-11-22 MED ORDER — ONDANSETRON HCL 4 MG PO TABS: 4.0000 mg | ORAL_TABLET | ORAL | Status: DC | PRN

## 2018-11-22 MED ORDER — LACTATED RINGERS IV SOLN: 500.0000 mL | Freq: Once | INTRAVENOUS | Status: DC

## 2018-11-22 MED ORDER — ONDANSETRON HCL 4 MG/2ML IJ SOLN: 4.0000 mg | Freq: Four times a day (QID) | INTRAMUSCULAR | Status: DC | PRN

## 2018-11-22 MED ORDER — OXYCODONE-ACETAMINOPHEN 5-325 MG PO TABS: 2.0000 | ORAL_TABLET | ORAL | Status: DC | PRN

## 2018-11-22 MED ORDER — SOD CITRATE-CITRIC ACID 500-334 MG/5ML PO SOLN: 30.0000 mL | ORAL | Status: DC | PRN

## 2018-11-22 MED ORDER — OXYTOCIN 40 UNITS IN NORMAL SALINE INFUSION - SIMPLE MED
1.0000 m[IU]/min | INTRAVENOUS | Status: DC
  Administered 2018-11-22: 2 m[IU]/min via INTRAVENOUS
  Filled 2018-11-22: qty 1000

## 2018-11-22 MED ORDER — COCONUT OIL OIL: 1.0000 "application " | TOPICAL_OIL | Status: DC | PRN

## 2018-11-22 MED ORDER — PHENYLEPHRINE 40 MCG/ML (10ML) SYRINGE FOR IV PUSH (FOR BLOOD PRESSURE SUPPORT): 80.0000 ug | PREFILLED_SYRINGE | INTRAVENOUS | Status: DC | PRN

## 2018-11-22 MED ORDER — TETANUS-DIPHTH-ACELL PERTUSSIS 5-2.5-18.5 LF-MCG/0.5 IM SUSP
0.5000 mL | Freq: Once | INTRAMUSCULAR | Status: AC
  Administered 2018-11-23: 0.5 mL via INTRAMUSCULAR
  Filled 2018-11-22: qty 0.5

## 2018-11-22 MED ORDER — MEASLES, MUMPS & RUBELLA VAC IJ SOLR: 0.5000 mL | Freq: Once | INTRAMUSCULAR | Status: DC

## 2018-11-22 MED ORDER — OXYTOCIN BOLUS FROM INFUSION: 500.0000 mL | Freq: Once | INTRAVENOUS | Status: DC

## 2018-11-22 MED ORDER — DIBUCAINE (PERIANAL) 1 % EX OINT: 1.0000 "application " | TOPICAL_OINTMENT | CUTANEOUS | Status: DC | PRN

## 2018-11-22 MED ORDER — WITCH HAZEL-GLYCERIN EX PADS: 1.0000 "application " | MEDICATED_PAD | CUTANEOUS | Status: DC | PRN

## 2018-11-22 MED ORDER — OXYTOCIN 40 UNITS IN NORMAL SALINE INFUSION - SIMPLE MED: 2.5000 [IU]/h | INTRAVENOUS | Status: DC

## 2018-11-22 MED ORDER — ACETAMINOPHEN 325 MG PO TABS: 650.0000 mg | ORAL_TABLET | ORAL | Status: DC | PRN

## 2018-11-22 MED ORDER — DIPHENHYDRAMINE HCL 25 MG PO CAPS: 25.0000 mg | ORAL_CAPSULE | Freq: Four times a day (QID) | ORAL | Status: DC | PRN

## 2018-11-22 MED ORDER — TERBUTALINE SULFATE 1 MG/ML IJ SOLN: 0.2500 mg | Freq: Once | INTRAMUSCULAR | Status: DC | PRN

## 2018-11-22 MED ORDER — FENTANYL-BUPIVACAINE-NACL 0.5-0.125-0.9 MG/250ML-% EP SOLN: 12.0000 mL/h | EPIDURAL | Status: DC | PRN

## 2018-11-22 MED ORDER — PRENATAL MULTIVITAMIN CH
1.0000 | ORAL_TABLET | Freq: Every day | ORAL | Status: DC
  Administered 2018-11-23: 1 via ORAL
  Filled 2018-11-22: qty 1

## 2018-11-22 MED ORDER — DIPHENHYDRAMINE HCL 50 MG/ML IJ SOLN: 12.5000 mg | INTRAMUSCULAR | Status: DC | PRN

## 2018-11-22 NOTE — Discharge Summary (Addendum)
Postpartum Discharge Summary *[employer] audio Interpreter [phone] & [phone] used to discuss d/c plans   Patient Name: Jessica Bender DOB: 1985/04/03 MRN: [id number]  Date of admission: 11/22/2018 Delivering Provider: Chauncey Mann   Date of discharge: 11/23/2018  Admitting diagnosis: vaginal bleeding preg Intrauterine pregnancy: [redacted]w[redacted]d    Secondary diagnosis:  Active Problems:   Language barrier   Labor and delivery indication for care or intervention   [redacted] weeks gestation of pregnancy   Latent tuberculosis  Additional problems: None     Discharge diagnosis: Term Pregnancy Delivered                                                                                                Post partum procedures:None  Augmentation: AROM and Pitocin  Complications: None  Hospital course:  Onset of Labor With Vaginal Delivery     33y.o. yo [id number] admitted in Latent Labor on 11/22/2018. Initial SVE: 5/80/-3. Patient received AROM and Pitocin and quickly then progressed to complete. Membrane Rupture Time/Date: 3:20 PM ,11/22/2018   Intrapartum Procedures: Episiotomy: None [1]                                         Lacerations:  None [1]  Patient had a delivery of a Viable infant. 11/22/2018  Information for the patient's newborn:  MuHind, Cheslerirl JaVani0[[id number]]Delivery Method: Vaginal, Spontaneous(Filed from Delivery Summary)     Pateint had an uncomplicated postpartum course. Patient with latent TB with negative CXR and instructed to f/u at HD on discharge. She is ambulating, tolerating a regular diet, passing flatus, and urinating well. Patient is discharged home in stable condition on 11/23/18.  Delivery time: 4:39 PM    Magnesium Sulfate received: No BMZ received: No Rhophylac:No MMR:No Transfusion:No  Physical exam  Vitals:   11/22/18 2300 11/23/18 0326 11/23/18 0830 11/23/18 1300  BP: 106/62 123/80 98/71 105/69  Pulse: 87 68 63 75  Resp: '18  18 17 18  '$ Temp: 98.2 F (36.8 C) 98 F (36.7 C) 97.9 F (36.6 C) (!) 97.4 F (36.3 C)  TempSrc: Oral Oral Oral Oral  SpO2: 98% 100%  100%   General: alert, cooperative and no distress Lochia: appropriate Uterine Fundus: firm Incision: N/A DVT Evaluation: No evidence of DVT seen on physical exam. Negative Homan's sign. No cords or calf tenderness. No significant calf/ankle edema. Labs: Lab Results  Component Value Date   WBC 9.2 11/22/2018   HGB 14.2 11/22/2018   HCT 40.8 11/22/2018   MCV 92.1 11/22/2018   PLT 177 11/22/2018   CMP Latest Ref Rng & Units 08/07/2018  Glucose 70 - 99 mg/dL 73  BUN 6 - 20 mg/dL 6  Creatinine 0.44 - 1.00 mg/dL 0.37(L)  Sodium 135 - 145 mmol/L 136  Potassium 3.5 - 5.1 mmol/L 4.0  Chloride 98 - 111 mmol/L 105  CO2 22 - 32 mmol/L 24  Calcium 8.9 - 10.3 mg/dL 9.4  Total Protein 6.5 - 8.1  g/dL 6.1(L)  Total Bilirubin 0.3 - 1.2 mg/dL 0.6  Alkaline Phos 38 - 126 U/L 46  AST 15 - 41 U/L 15  ALT 0 - 44 U/L 9    Discharge instruction: per After Visit Summary and "Baby and Me Booklet".  After visit meds:  Allergies as of 11/22/2018   No Known Allergies      Diet: routine diet  Activity: Advance as tolerated. Pelvic rest for 6 weeks.   Outpatient follow up:4 weeks Follow up Appt: Future Appointments  Date Time Provider [place]  11/26/2018  3:35 PM Jorje Guild, NP [facility]   Follow up Visit: [facility]. Go to.   Specialty: Obstetrics and Gynecology Why: As scheduled Contact information: [address] [place], [place] [id number] [place] [id number] [phone]           Please schedule this patient for Postpartum visit in: 4 weeks with the following provider: Any provider Low risk pregnancy complicated by: latent TB; marginal cord insertion. Delivery mode:  SVD Anticipated Birth Control:  POPs PP Procedures needed: None   Schedule Integrated BH visit: no   Newborn Data: Live born female  Birth Weight: 7 lbs 4.1 oz (3291 g) APGAR: 8, 9  Newborn Delivery   Birth date/time: 11/22/2018 16:39:00 Delivery type: Vaginal, Spontaneous      Baby Feeding: Breast Disposition:home with mother   11/23/2018 Laury Deep, CNM

## 2018-11-22 NOTE — H&P (Signed)
OBSTETRIC ADMISSION HISTORY AND PHYSICAL  Jessica Bender is a 33 y.o. female [phone] with IUP at [redacted]w[redacted]d by U/S week 30 presenting for vaginal bleeding. She reports +FMs, No LOF, no VB, no blurry vision, headaches or peripheral edema, and RUQ pain.  She plans on both breast feeding and formula. She is still considering which method for birth control post partum. She received her prenatal care at [facility]   Dating: By Ultra sound --->  Estimated Date of Delivery: 12/09/18  Sono:  11/05/18  @[redacted]w[redacted]d , CWD, Sub-optimal views of anatomy, cephalic presentation, posterior placental lie, 2796g, 41% EFW  Prenatal History/Complications: Marginal insertion of umbilical cord noted this pregnancy  Past Medical History: Past Medical History:  Diagnosis Date  . Hearing loss    right ear-   . TB lung, latent     Past Surgical History: Past Surgical History:  Procedure Laterality Date  . NO PAST SURGERIES      Obstetrical History: OB History    Gravida  5   Para  3   Term  3   Preterm      AB  1   Living  3     SAB  1   TAB      Ectopic      Multiple      Live Births  3           Social History: Social History   Socioeconomic History  . Marital status: Single    Spouse name: Not on file  . Number of children: Not on file  . Years of education: Not on file  . Highest education level: Not on file  Occupational History  . Not on file  Social Needs  . Financial resource strain: Not on file  . Food insecurity    Worry: Sometimes true    Inability: Sometimes true  . Transportation needs    Medical: Yes    Non-medical: Yes  Tobacco Use  . Smoking status: Never Smoker  . Smokeless tobacco: Never Used  Substance and Sexual Activity  . Alcohol use: Never    Frequency: Never  . Drug use: Never  . Sexual activity: Yes    Birth control/protection: None  Lifestyle  . Physical activity    Days per week: Not on file    Minutes per session: Not on file  .  Stress: Not on file  Relationships  . Social Herbalist on phone: Not on file    Gets together: Not on file    Attends religious service: Not on file    Active member of club or organization: Not on file    Attends meetings of clubs or organizations: Not on file    Relationship status: Not on file  Other Topics Concern  . Not on file  Social History Narrative  . Not on file    Family History: History reviewed. No pertinent family history.  History reviewed. No pertinent family history.  Allergies: No Known Allergies  Medications Prior to Admission  Medication Sig Dispense Refill Last Dose  . calcium carbonate (TUMS) 500 MG chewable tablet Chew 1 tablet (200 mg of elemental calcium total) by mouth daily. 120 tablet 0 11/21/2018 at Unknown time  . omeprazole (PRILOSEC) 40 MG capsule Take 1 capsule (40 mg total) by mouth daily. 30 capsule 1 11/22/2018 at Unknown time  . Prenat-Fe Poly-Methfol-FA-DHA (VITAFOL ULTRA) 29-0.6-0.4-200 MG CAPS Take 1 capsule by mouth daily. 30 capsule 11 11/22/2018 at Unknown  time  . Prenatal Vit-DSS-Fe Fum-FA (SE-NATAL 19) 29-1 MG TABS TK 1 T PO QD     . Prenatal Vit-Fe Fumarate-FA (MULTIVITAMIN-PRENATAL) 27-0.8 MG TABS tablet Take 1 tablet by mouth daily at 12 noon. (Patient not taking: Reported on 11/12/2018) 90 tablet 3      Review of Systems   All systems reviewed and negative except as stated in HPI  Blood pressure (!) 105/59, pulse 68, temperature 98 F (36.7 C), resp. rate 18, SpO2 100 %. General appearance: cooperative and no distress Lungs:  Heart:  Abdomen: soft, non-tender; bowel sounds normal Pelvic: gravid uterus GU: No vaginal lesions, sparse hematuria Extremities: Homans sign is negative, no sign of DVT Presentation: cephalic Fetal monitoringBaseline: 145 bpm, Variability: Good {> 6 bpm), Accelerations: 15x15 and Decelerations: Absent Uterine activity Date/time of onset: 0600, Frequency: q30-7040mins Dilation: 5 Effacement  (%): 80 Station: -3 Exam by:: Sabas SousJ. emly CNM   Prenatal labs: ABO, Rh: --/--/O POS (10/08 1218) Antibody: PENDING (10/08 1218) Rubella: Immune (06/10 0000) RPR: Non Reactive (08/03 0941)  HBsAg: Negative (06/10 0000)  HIV: Non Reactive (08/03 0941)  GBS: Negative/-- (09/21 1627)  2 hr Glucola WNL Genetic screening  Low risk  Anatomy US non confirmatory-difficulties noted due to gestational age and fetal position  Prenatal Transfer Tool  Maternal Diabetes: No Genetic Screening: Declined Maternal Ultrasounds/Referrals: Other: insertion of umbilical cord Fetal Ultrasounds or other Referrals:  None Maternal Substance Abuse:  No Significant Maternal Medications:  None Significant Maternal Lab Results: Other: TB positive, asymptomatic  Results for orders placed or performed during the hospital encounter of 11/22/18 (from the past 24 hour(s))  CBC   Collection Time: 11/22/18 12:18 PM  Result Value Ref Range   WBC 9.2 4.0 - 10.5 K/uL   RBC 4.43 3.87 - 5.11 MIL/uL   Hemoglobin 14.2 12.0 - 15.0 g/dL   HCT 16.140.8 09.636.0 - 04.546.0 %   MCV 92.1 80.0 - 100.0 fL   MCH 32.1 26.0 - 34.0 pg   MCHC 34.8 30.0 - 36.0 g/dL   RDW 40.912.7 81.111.5 - 91.415.5 %   Platelets 177 150 - 400 K/uL   nRBC 0.0 0.0 - 0.2 %  Type and screen [facility]   Collection Time: 11/22/18 12:18 PM  Result Value Ref Range   ABO/RH(D) O POS    Antibody Screen PENDING    Sample Expiration      11/25/2018,2359 Performed at [facility], [address] [place], [place] [id number]     Patient Active Problem List   Diagnosis Date Noted  . Labor and delivery indication for care or intervention 11/22/2018  . Marginal insertion of umbilical cord affecting management of mother in third trimester 10/17/2018  . Supervision of low-risk pregnancy 08/08/2018  . Unilateral deafness, right 06/11/2015  . Language barrier 01/07/2015  . Positive TB test 01/07/2015    Assessment/Plan:  [name] is a 33  y.o. [id number] at 5755w5d here for vaginal bleeding noted 0600, now admitted for IOL. AROM completed at 1520.  #Labor: IOL pitocin IV started 1530 w/ AROM, anticipate SVD #Pain: Patient refused epidural #FWB: CAT 1; EFW: 2796g #ID:  GBS negative #MOF: Breast and formula feed #MOC: POP  Positive TB test [facility] added to problem list for positive TB test - info added on 11-08-18.  Signs of TB reviewed with client and she states no to fevers, night sweats, coughing, hemoptysis, weight loss, SOB, Chest pain.  She is having heartburn  which is normal in late pregnancy.  Order printed for her to take to [facility] on [address] and walk in for the chest xray.  [place] MS3   11/22/2018, 1:21 PM  I saw and evaluated the patient. I agree with the findings and the plan of care as documented in the student's note. SOL with concern for abnormal vaginal bleeding in MAU. AROM performed on admission and will start Pitocin. Anticipate vaginal delivery. Patient with latent TB and will f/u at HD.  CXR 9/28: No active cardiopulmonary process. No radiographic evidence of tuberculosis.  Jerilynn Birkenhead, MD [facility] Family Medicine Fellow, [facility] for [employer], [facility]

## 2018-11-22 NOTE — MAU Provider Note (Signed)
@  MAUPATCONTACT@   S: [name] is a 33 y.o. [phone] at [redacted]w[redacted]d  who presents to MAU today complaining of back pain and vaginal bleeding. She denies LOF. She reports normal fetal movement.    O: BP (!) 105/59   Pulse 68   Temp 98 F (36.7 C)   Resp 18   SpO2 100%  GENERAL: Well-developed, well-nourished female in no acute distress.  HEAD: Normocephalic, atraumatic.  CHEST: Normal effort of breathing, regular heart rate ABDOMEN: Soft, nontender, gravid GS:[id number] Exam: Pink mucosa.  Small amt blood in vault. Cervix appears open.   Cervical exam:  Dilation: 5 Effacement (%): 80 Cervical Position: Middle Station: -3 Presentation: Vertex Exam by:: Milinda Cave CNM   Fetal Monitoring: Baseline: 145 Variability: Moderate Accelerations: Present Decelerations: None Contractions: Irregular  A: SIUP at [redacted]w[redacted]d  Active labor  P: Nurse instructed to call labor team for admission.   Gavin Pound, CNM 11/22/2018 2:05 PM

## 2018-11-22 NOTE — MAU Note (Signed)
.   Jessica Bender is a 33 y.o. at [redacted]w[redacted]d here in MAU reporting: vaginal bleeding that started around 6 this morning  Onset of complaint: 6am Pain score:  Vitals:   11/22/18 0907  BP: 106/62  Pulse: 86  Resp: 16  Temp: 98 F (36.7 C)  SpO2: 100%     FHT:156 Lab orders placed from triage:

## 2018-11-23 LAB — RPR: RPR Ser Ql: NONREACTIVE

## 2018-11-23 MED ORDER — IBUPROFEN 600 MG PO TABS: 600.0000 mg | ORAL_TABLET | Freq: Three times a day (TID) | ORAL | 0 refills | Status: DC | PRN

## 2018-11-23 MED ORDER — INFLUENZA VAC SPLIT QUAD 0.5 ML IM SUSY
0.5000 mL | PREFILLED_SYRINGE | INTRAMUSCULAR | Status: AC
  Administered 2018-11-23: 0.5 mL via INTRAMUSCULAR
  Filled 2018-11-23: qty 0.5

## 2018-11-23 NOTE — Progress Notes (Signed)
CSW verbally consulted as MOB requested Baby Box and other information on [facility]. CSW spoke with MOB at bedside. CSW provided MOB with Alexis Frock and information on [employer]. CSW used interpretor to speak with MOB. MOB advised CSW that she had al lneeded supplies for baby and that someone was at the market getting a carseat for infant. CSW advised MOB that infant would not be able to leave without a carseat. MOB reported that it should be here before infant is ready to discharge. CSW worked with [employer] on [employer] office. Considering language barrier CSW provided information to [facility] staff for MOB. MOB was given another appointment for November 29, 2018. Per [facility] staff MOB already gets [facility]. MOB has just failed to go to recent appointments. CSW left [facility] appointment on bedside table for MOB. MOB reported that she had no other concerns or needs at this time.      Jessica Bender, MSW, LCSW [facility] [place] [facility] [place] [phone]

## 2018-11-23 NOTE — Progress Notes (Signed)
Discharge instructions and education reviewed with mom using [employer] interpreter # [phone].

## 2018-11-23 NOTE — Progress Notes (Signed)
POSTPARTUM PROGRESS NOTE  POD #01  Subjective:  Jessica Bender is a 33 y.o. [id number] s/p SVD after AROM @ [redacted]w[redacted]d.  She reports she doing well. No acute events overnight. She reports she is doing well without any problems with ambulating, voiding or po intake. Denies nausea or vomiting. She has passed flatus and had BM. Pain is minimal, she has no complaints.  Minimal lochia serrosanguinous  Objective: Blood pressure 98/71, pulse 63, temperature 97.9 F (36.6 C), temperature source Oral, resp. rate 17, SpO2 100 %, currently breast & bottlefeeding.  Physical Exam:  General: alert, cooperative and no distress Chest: no respiratory distress Heart:regular rate, distal pulses intact Abdomen: soft, nontender,  Uterine Fundus: firm, appropriately tender DVT Evaluation: No calf swelling or tenderness Extremities: no edema Skin: warm, dry  Recent Labs    11/22/18 1218  HGB 14.2  HCT 40.8    Assessment/Plan: Jessica Bender is a 32 y.o. [id number] s/p SVDat [redacted]w[redacted]d for was admitted for vaginal bleeding in latent labor on 11/22/2018. Patient received AROM and Pitocin and quickly progressed to complete. No post labor complications for mother or neonate.  POD#01 - Doing welll; pain controlled. H/H appropriate  Routine postpartum care  OOB, ambulated  Lovenox for VTE prophylaxis  Contraception: Patient still considering. Feeding: Formula feeding, patient states she is not making enough milk to breast feed currently. Will try again later today.  -Will f/u w lactation consult    Dispo: Plan for discharge: within 24 hours   LOS: 1 day   Sherrian Divers 11/23/2018, 9:27 AM   I confirm that I have verified the information documented in the medical student's note and that I have also personally reperformed the history, physical exam and all medical decision making activities of this service and have verified that all service and findings are accurately documented in this student's  note.    Laury Deep, CNM 11/23/2018 8:10 PM

## 2018-11-26 ENCOUNTER — Encounter: Payer: Medicaid Other | Admitting: Student

## 2018-12-25 ENCOUNTER — Ambulatory Visit: Payer: Medicaid Other | Admitting: Student

## 2018-12-31 ENCOUNTER — Encounter: Payer: Self-pay | Admitting: Nurse Practitioner

## 2018-12-31 ENCOUNTER — Ambulatory Visit: Payer: Medicaid Other | Admitting: Clinical

## 2018-12-31 ENCOUNTER — Other Ambulatory Visit: Payer: Self-pay

## 2018-12-31 ENCOUNTER — Other Ambulatory Visit (HOSPITAL_COMMUNITY)
Admission: RE | Admit: 2018-12-31 | Discharge: 2018-12-31 | Disposition: A | Discharge status: Home / Self Care | Payer: Medicaid Other | Source: Ambulatory Visit | Attending: Nurse Practitioner | Admitting: Nurse Practitioner

## 2018-12-31 ENCOUNTER — Ambulatory Visit (INDEPENDENT_AMBULATORY_CARE_PROVIDER_SITE_OTHER): Payer: Medicaid Other | Admitting: Nurse Practitioner

## 2018-12-31 DIAGNOSIS — Z124 Encounter for screening for malignant neoplasm of cervix: Secondary | ICD-10-CM | POA: Insufficient documentation

## 2018-12-31 DIAGNOSIS — Z658 Other specified problems related to psychosocial circumstances: Secondary | ICD-10-CM

## 2018-12-31 DIAGNOSIS — Z659 Problem related to unspecified psychosocial circumstances: Secondary | ICD-10-CM | POA: Insufficient documentation

## 2018-12-31 DIAGNOSIS — Z1389 Encounter for screening for other disorder: Secondary | ICD-10-CM

## 2018-12-31 DIAGNOSIS — Z789 Other specified health status: Secondary | ICD-10-CM

## 2018-12-31 LAB — POCT PREGNANCY, URINE: Preg Test, Ur: NEGATIVE

## 2018-12-31 MED ORDER — NORGESTIMATE-ETH ESTRADIOL 0.25-35 MG-MCG PO TABS: 1.0000 | ORAL_TABLET | Freq: Every day | ORAL | 4 refills | Status: DC
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
## 2018-12-31 NOTE — Patient Instructions (Addendum)
[facility] [address] resource for birth control pills if she does not have insurance.    [place] will contact you.

## 2018-12-31 NOTE — BH Specialist Note (Signed)
Integrated Behavioral Health Initial Visit  MRN: [id number] Name: Jessica Bender  Number of [place] Clinician visits:: 1/6 Session Start time: 5:08  Session End time: 5:23 Total time: 15  Type of Service: [place] Interpretor:Yes.   Interpretor Name and Language: Kinyarwanda   Warm Hand Off Completed.       SUBJECTIVE: Jessica Bender is a 33 y.o. female accompanied by Newborn daughter Patient was referred by [name], NP for psychosocial stress. Patient reports the following symptoms/concerns: Pt states her primary concern today is worry over losing insurance postpartum, financial concerns while out of work with a new baby.  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 Reduce symptoms of: stress 2. Increase knowledge and/or ability of: stress reduction  3. Demonstrate ability to: Increase healthy adjustment to current life circumstances and Increase adequate support systems for patient/family  INTERVENTIONS: Interventions utilized: Link to [employer]  Standardized Assessments completed: Flavia Shipper Postnatal Depression  ASSESSMENT: Patient currently experiencing Psychosocial stress.   Patient may benefit from brief therapeutic interventions today.  PLAN: 1. Follow up with behavioral health clinician on : As needed 2. Behavioral recommendations:  -Accept referral from medical provider today for PCP -Take home diapers today 3. Referral(s): [facility] and [employer] Resources:  PCP 4. "From scale of 1-10, how likely are you to follow plan?": -  Garlan Fair, LCSW  Depression screen [facility] 2/9 08/08/2018  Decreased Interest 1  Down, Depressed, Hopeless 1  PHQ - 2 Score 2  Altered sleeping 1  Tired, decreased energy 1  Change in appetite 0  Feeling bad or failure about yourself  0  Trouble concentrating 0  Moving slowly or fidgety/restless 0  Suicidal thoughts 0  PHQ-9 Score 4   GAD 7 : Generalized Anxiety Score 08/08/2018  Nervous, Anxious, on Edge 1  Control/stop worrying 2  Worry too much - different things 2  Trouble relaxing 0  Restless 0  Easily annoyed or irritable 1  Afraid - awful might happen 0  Total GAD 7 Score 6

## 2019-01-07 ENCOUNTER — Telehealth: Payer: Self-pay | Admitting: Licensed Clinical Social Worker

## 2019-01-07 NOTE — Telephone Encounter (Signed)
Call placed to patient utilizing Jessica Bender [phone]) to schedule appointment with [facility]. LCSW left a message for a return call.

## 2019-01-09 ENCOUNTER — Telehealth: Payer: Self-pay | Admitting: Licensed Clinical Social Worker

## 2019-01-09 NOTE — Telephone Encounter (Signed)
Call placed to patient utilizing [employer]. LCSW introduced self and explained role at [facility]. Pt was provided an appointment at [facility] and their contact information.

## 2019-01-14 ENCOUNTER — Ambulatory Visit: Payer: Medicaid Other

## 2019-01-15 ENCOUNTER — Telehealth: Payer: Self-pay | Admitting: Lactation Services

## 2019-01-15 ENCOUNTER — Ambulatory Visit: Payer: Medicaid Other

## 2019-01-15 ENCOUNTER — Telehealth: Payer: Self-pay | Admitting: Advanced Practice Midwife

## 2019-01-15 NOTE — Telephone Encounter (Signed)
Called Cytology to inquire about Pap results from 11/16. Results stating active in the chart. Cytology says it is completed and will fax results and have supervisor see if they can get it released to view.

## 2019-01-15 NOTE — Telephone Encounter (Signed)
Interpreter id [phone] The patient called and requested an interpreter for Kinyarwanda. The patient stated she missed her appointment and would like to reschedule; however she can only attend the appointment on 12/14 or 12/21. She stated she can also come in at certain times. Rescheduled the appointment

## 2019-01-16 ENCOUNTER — Encounter: Payer: Self-pay | Admitting: Nurse Practitioner

## 2019-01-16 ENCOUNTER — Telehealth: Payer: Self-pay

## 2019-01-16 DIAGNOSIS — R87610 Atypical squamous cells of undetermined significance on cytologic smear of cervix (ASC-US): Secondary | ICD-10-CM | POA: Insufficient documentation

## 2019-01-16 LAB — CYTOLOGY - PAP
Chlamydia: NEGATIVE
Comment: NEGATIVE
Comment: NEGATIVE
Comment: NEGATIVE
Comment: NORMAL
Diagnosis: UNDETERMINED — AB
High risk HPV: NEGATIVE
Neisseria Gonorrhea: NEGATIVE
Trichomonas: NEGATIVE

## 2019-01-16 NOTE — Telephone Encounter (Addendum)
-----   Message from Virginia Rochester, NP sent at 01/16/2019  4:37 PM EST ----- Will need pap with cotesting in one year 12-2019.  Please call with interpreter and let her know.  Called pt with [facility] # [phone] and LM that her results from her 12/31/18 appt require follow up in one year.  If she could please call the office if she has any questions. Letter sent.

## 2019-01-22 ENCOUNTER — Ambulatory Visit (INDEPENDENT_AMBULATORY_CARE_PROVIDER_SITE_OTHER): Payer: Medicaid Other | Admitting: Primary Care

## 2019-02-04 ENCOUNTER — Other Ambulatory Visit: Payer: Self-pay

## 2019-02-04 ENCOUNTER — Ambulatory Visit (INDEPENDENT_AMBULATORY_CARE_PROVIDER_SITE_OTHER): Payer: Medicaid Other

## 2019-02-04 VITALS — BP 119/68 | HR 72 | Ht 66.0 in | Wt 188.0 lb

## 2019-02-04 DIAGNOSIS — Z0130 Encounter for examination of blood pressure without abnormal findings: Secondary | ICD-10-CM

## 2019-02-04 DIAGNOSIS — Z3202 Encounter for pregnancy test, result negative: Secondary | ICD-10-CM

## 2019-02-04 DIAGNOSIS — Z3040 Encounter for surveillance of contraceptives, unspecified: Secondary | ICD-10-CM

## 2019-02-04 LAB — POCT PREGNANCY, URINE: Preg Test, Ur: NEGATIVE

## 2019-02-04 MED ORDER — NORGESTIMATE-ETH ESTRADIOL 0.25-35 MG-MCG PO TABS: 1.0000 | ORAL_TABLET | Freq: Every day | ORAL | 4 refills | Status: DC

## 2019-02-04 NOTE — Progress Notes (Signed)
Pt here today for UPT-Negative & BP check: 119/68 pulse-72. Pt also needed appt to discuss other BC options.

## 2019-02-25 ENCOUNTER — Ambulatory Visit: Payer: Medicaid Other | Admitting: Student

## 2019-04-29 ENCOUNTER — Other Ambulatory Visit (INDEPENDENT_AMBULATORY_CARE_PROVIDER_SITE_OTHER): Payer: Self-pay | Admitting: Primary Care

## 2019-04-29 ENCOUNTER — Other Ambulatory Visit: Payer: Self-pay

## 2019-04-29 ENCOUNTER — Ambulatory Visit (INDEPENDENT_AMBULATORY_CARE_PROVIDER_SITE_OTHER): Payer: Medicaid Other | Admitting: Primary Care

## 2019-04-29 ENCOUNTER — Encounter (INDEPENDENT_AMBULATORY_CARE_PROVIDER_SITE_OTHER): Payer: Self-pay | Admitting: Primary Care

## 2019-04-29 VITALS — BP 112/73 | HR 72 | Temp 97.3°F | Ht 66.0 in | Wt 195.6 lb

## 2019-04-29 DIAGNOSIS — H9012 Conductive hearing loss, unilateral, left ear, with unrestricted hearing on the contralateral side: Secondary | ICD-10-CM

## 2019-04-29 DIAGNOSIS — Z7689 Persons encountering health services in other specified circumstances: Secondary | ICD-10-CM

## 2019-04-29 DIAGNOSIS — K0889 Other specified disorders of teeth and supporting structures: Secondary | ICD-10-CM | POA: Diagnosis not present

## 2019-04-29 MED ORDER — IBUPROFEN 600 MG PO TABS: 600.0000 mg | ORAL_TABLET | Freq: Three times a day (TID) | ORAL | 0 refills | Status: DC | PRN

## 2019-04-29 NOTE — Patient Instructions (Signed)

## 2019-04-29 NOTE — Progress Notes (Signed)
Unable to chew on right side due to pain

## 2019-04-29 NOTE — Progress Notes (Signed)
New Patient Office Visit  Subjective:  Patient ID: Jessica Bender, female    DOB: October 13, 1985  Age: 34 y.o. MRN: [id number]  CC:  Chief Complaint  Patient presents with  . New Patient (Initial Visit)    dental pain     HPI Jessica Bender presents for establishment of care and has difficulty chewing due to wisdom tooth on the right side. Tooth is sensitive to temperature change  and unable to eat on right side. Pain 5/10.   Past Medical History:  Diagnosis Date  . Hearing loss    right ear-   . TB lung, latent    negative chest x-ray 2020    Past Surgical History:  Procedure Laterality Date  . NO PAST SURGERIES      No family history on file.  Social History   Socioeconomic History  . Marital status: Single    Spouse name: Not on file  . Number of children: Not on file  . Years of education: Not on file  . Highest education level: Not on file  Occupational History  . Not on file  Tobacco Use  . Smoking status: Never Smoker  . Smokeless tobacco: Never Used  Substance and Sexual Activity  . Alcohol use: Never  . Drug use: Never  . Sexual activity: Yes    Birth control/protection: None  Other Topics Concern  . Not on file  Social History Narrative  . Not on file   Social Determinants of Health   Financial Resource Strain:   . Difficulty of Paying Living Expenses:   Food Insecurity: Food Insecurity Present  . Worried About Charity fundraiser in the Last Year: Sometimes true  . Ran Out of Food in the Last Year: Sometimes true  Transportation Needs: Unmet Transportation Needs  . Lack of Transportation (Medical): Yes  . Lack of Transportation (Non-Medical): Yes  Physical Activity:   . Days of Exercise per Week:   . Minutes of Exercise per Session:   Stress:   . Feeling of Stress :   Social Connections:   . Frequency of Communication with Friends and Family:   . Frequency of Social Gatherings with Friends and Family:   . Attends  Religious Services:   . Active Member of Clubs or Organizations:   . Attends Archivist Meetings:   Marland Kitchen Marital Status:   Intimate Partner Violence:   . Fear of Current or Ex-Partner:   . Emotionally Abused:   Marland Kitchen Physically Abused:   . Sexually Abused:     ROS Review of Systems  HENT: Positive for hearing loss.        Right tooth pain wisdom tooth  Right ear decrease hearing  All other systems reviewed and are negative.   Objective:   Today's Vitals: BP 112/73 (BP Location: Right Arm, Patient Position: Sitting, Cuff Size: Normal)   Pulse 72   Temp (!) 97.3 F (36.3 C) (Temporal)   Ht 5\' 6"  (1.676 m)   Wt 195 lb 9.6 oz (88.7 kg)   SpO2 97%   Breastfeeding Yes   BMI 31.57 kg/m   Physical Exam Vitals reviewed.  Constitutional:      Appearance: She is obese.  HENT:     Head: Normocephalic.     Right Ear: Tympanic membrane normal.     Left Ear: Tympanic membrane normal.     Nose: Nose normal.  Eyes:     Extraocular Movements: Extraocular movements intact.     Pupils: Pupils  are equal, round, and reactive to light.  Cardiovascular:     Rate and Rhythm: Normal rate and regular rhythm.  Pulmonary:     Effort: Pulmonary effort is normal.     Breath sounds: Normal breath sounds.  Abdominal:     General: Bowel sounds are normal.  Musculoskeletal:        General: Normal range of motion.     Cervical back: Normal range of motion and neck supple.  Skin:    General: Skin is warm and dry.  Neurological:     General: No focal deficit present.     Mental Status: She is alert and oriented to person, place, and time.  Psychiatric:        Mood and Affect: Mood normal.        Behavior: Behavior normal.        Thought Content: Thought content normal.        Judgment: Judgment normal.     Assessment & Plan:  Jessica Bender was seen today for new patient (initial visit).  Diagnoses and all orders for this visit:  Encounter to establish care [name], NP-C  will be your  (PCP) she is mastered prepared . She is skilled to diagnosed and treat illness. Also able to answer health concern as well as continuing care of varied medical conditions, not limited by cause, organ system, or diagnosis.   Pain, dental Wisdom tooth probably needs to be removed   Class 2 severe obesity due to excess calories with serious comorbidity in adult, unspecified BMI (HCC) Obesity is 30-39 indicating an excess in caloric intake or underlining conditions. This may lead to other co-morbidities. Examples are hypertension, respiratory issues and increase risk with cardiovascular disease Lifestyle modifications of diet and exercise may reduce obesity.   Conductive hearing loss of left ear with unrestricted hearing of right ear Tested with Webber/rhinne negative for both on the right side    Outpatient Encounter Medications as of 04/29/2019  Medication Sig  . norgestimate-ethinyl estradiol (ORTHO-CYCLEN) 0.25-35 MG-MCG tablet Take 1 tablet by mouth daily.  . [DISCONTINUED] calcium carbonate (TUMS) 500 MG chewable tablet Chew 1 tablet (200 mg of elemental calcium total) by mouth daily. (Patient not taking: Reported on 12/31/2018)  . [DISCONTINUED] ibuprofen (ADVIL) 600 MG tablet Take 1 tablet (600 mg total) by mouth every 8 (eight) hours as needed for mild pain. (Patient not taking: Reported on 12/31/2018)  . [DISCONTINUED] omeprazole (PRILOSEC) 40 MG capsule Take 1 capsule (40 mg total) by mouth daily. (Patient not taking: Reported on 12/31/2018)   No facility-administered encounter medications on file as of 04/29/2019.    Follow-up: Return for return for cerumen irragation and labs inperson .   Jessica Sessions, NP

## 2019-05-13 ENCOUNTER — Other Ambulatory Visit: Payer: Self-pay

## 2019-05-13 ENCOUNTER — Encounter (INDEPENDENT_AMBULATORY_CARE_PROVIDER_SITE_OTHER): Payer: Self-pay | Admitting: Primary Care

## 2019-05-13 ENCOUNTER — Ambulatory Visit (INDEPENDENT_AMBULATORY_CARE_PROVIDER_SITE_OTHER): Payer: Medicaid Other | Admitting: Primary Care

## 2019-05-13 DIAGNOSIS — W19XXXA Unspecified fall, initial encounter: Secondary | ICD-10-CM

## 2019-05-13 DIAGNOSIS — Z7689 Persons encountering health services in other specified circumstances: Secondary | ICD-10-CM

## 2019-05-13 DIAGNOSIS — Z9181 History of falling: Secondary | ICD-10-CM

## 2019-05-13 NOTE — Progress Notes (Signed)
Pt fell recently and hit head. Has a little pain

## 2019-05-13 NOTE — Patient Instructions (Signed)
Use a dropper to put olive oil or canola oil in the effected ear- 2-3 times a week. Let it soak for 20-30 min then you can take a shower or use a baby bulb with warm water to wash out the ear wax.  Do not use Qtips

## 2019-05-13 NOTE — Progress Notes (Signed)
Subjective:  Patient ID: Jessica Bender, female    DOB: 25-Nov-1985  Age: 34 y.o. MRN: [id number]  CC: Cerumen Impaction   HPI Jessica Bender presents for ear irrigation. She has had a head ache intermittent since her fall and hit the back of her head. She was sitting on a rolling chair trying to breast feed and fell back and hit the back of her head. The baby was safe in her arms. Due to her breast feeding only medication that can be taken is tylenol and ibuprofen over the counter  Outpatient Medications Prior to Visit  Medication Sig Dispense Refill  . ibuprofen (ADVIL) 600 MG tablet Take 1 tablet (600 mg total) by mouth every 8 (eight) hours as needed. (Patient not taking: Reported on 05/13/2019) 60 tablet 0  . norgestimate-ethinyl estradiol (ORTHO-CYCLEN) 0.25-35 MG-MCG tablet Take 1 tablet by mouth daily. 3 Package 4   No facility-administered medications prior to visit.    ROS Review of Systems  Neurological: Positive for headaches.  All other systems reviewed and are negative.   Objective:  BP 118/82 (BP Location: Right Arm, Patient Position: Sitting, Cuff Size: Normal)   Pulse 74   Temp (!) 97.2 F (36.2 C) (Temporal)   Ht '5\' 6"'$  (1.676 m)   Wt 194 lb 9.6 oz (88.3 kg)   LMP 04/14/2019 (Approximate)   SpO2 98%   Breastfeeding Yes   BMI 31.41 kg/m   BP/Weight 05/13/2019 04/29/2019 91/63/8466  Systolic BP 599 357 017  Diastolic BP 82 73 68  Wt. (Lbs) 194.6 195.6 188  BMI 31.41 31.57 30.34    Physical Exam Vitals reviewed.  Constitutional:      Appearance: She is obese.  HENT:     Left Ear: There is impacted cerumen.  Cardiovascular:     Rate and Rhythm: Normal rate and regular rhythm.  Pulmonary:     Effort: Pulmonary effort is normal.     Breath sounds: Normal breath sounds.  Abdominal:     General: Bowel sounds are normal.  Musculoskeletal:        General: Normal range of motion.     Cervical back: Normal range of motion and neck supple.   Skin:    General: Skin is warm and dry.  Neurological:     Mental Status: She is alert and oriented to person, place, and time.  Psychiatric:        Mood and Affect: Mood normal.        Behavior: Behavior normal.        Thought Content: Thought content normal.        Judgment: Judgment normal.      Assessment & Plan:  Jessica Bender was seen today for cerumen impaction.  Diagnoses and all orders for this visit:  health mantenace  -     CMP14+EGFR -     CBC with Differential   Class 2 severe obesity due to excess calories with serious comorbidity in adult, unspecified BMI ([place]) Obesity is 30-39 indicating an excess in caloric intake or underlining conditions. This may lead to other co-morbidities. Lifestyle modifications of diet and exercise may reduce obesity but at this time discourage until stop breast feeding.  - Lipid Panel  Breast feeding status of mother Limited medication that can be taken .  Status post fall She was sitting on a rolling chair trying to breast feed her baby and fell  back and hit the back of her head. Due to her breast feeding only medication that  can be taken is tylenol and ibuprofen over the counter   No orders of the defined types were placed in this encounter.   Follow-up: Return if symptoms worsen or fail to improve.   Kerin Perna NP

## 2019-05-14 LAB — CMP14+EGFR
ALT: 19 IU/L (ref 0–32)
AST: 16 IU/L (ref 0–40)
Albumin/Globulin Ratio: 1.4 (ref 1.2–2.2)
Albumin: 4 g/dL (ref 3.8–4.8)
Alkaline Phosphatase: 97 IU/L (ref 39–117)
BUN/Creatinine Ratio: 21 (ref 9–23)
BUN: 12 mg/dL (ref 6–20)
Bilirubin Total: 0.4 mg/dL (ref 0.0–1.2)
CO2: 23 mmol/L (ref 20–29)
Calcium: 10 mg/dL (ref 8.7–10.2)
Chloride: 104 mmol/L (ref 96–106)
Creatinine, Ser: 0.58 mg/dL (ref 0.57–1.00)
GFR calc Af Amer: 138 mL/min/{1.73_m2} (ref >59)
GFR calc non Af Amer: 120 mL/min/{1.73_m2} (ref >59)
Globulin, Total: 2.9 g/dL (ref 1.5–4.5)
Glucose: 82 mg/dL (ref 65–99)
Potassium: 4.1 mmol/L (ref 3.5–5.2)
Sodium: 139 mmol/L (ref 134–144)
Total Protein: 6.9 g/dL (ref 6.0–8.5)

## 2019-05-14 LAB — CBC WITH DIFFERENTIAL/PLATELET
Basophils Absolute: 0 10*3/uL (ref 0.0–0.2)
Basos: 0 %
EOS (ABSOLUTE): 0.1 10*3/uL (ref 0.0–0.4)
Eos: 1 %
Hematocrit: 41.3 % (ref 34.0–46.6)
Hemoglobin: 14.4 g/dL (ref 11.1–15.9)
Immature Grans (Abs): 0 10*3/uL (ref 0.0–0.1)
Immature Granulocytes: 1 %
Lymphocytes Absolute: 2.5 10*3/uL (ref 0.7–3.1)
Lymphs: 34 %
MCH: 31.1 pg (ref 26.6–33.0)
MCHC: 34.9 g/dL (ref 31.5–35.7)
MCV: 89 fL (ref 79–97)
Monocytes Absolute: 0.6 10*3/uL (ref 0.1–0.9)
Monocytes: 8 %
Neutrophils Absolute: 4.1 10*3/uL (ref 1.4–7.0)
Neutrophils: 56 %
Platelets: 276 10*3/uL (ref 150–450)
RBC: 4.63 x10E6/uL (ref 3.77–5.28)
RDW: 12.3 % (ref 11.7–15.4)
WBC: 7.4 10*3/uL (ref 3.4–10.8)

## 2019-05-14 LAB — LIPID PANEL
Chol/HDL Ratio: 4.1 ratio (ref 0.0–4.4)
Cholesterol, Total: 214 mg/dL — ABNORMAL HIGH (ref 100–199)
HDL: 52 mg/dL (ref >39)
LDL Chol Calc (NIH): 147 mg/dL — ABNORMAL HIGH (ref 0–99)
Triglycerides: 84 mg/dL (ref 0–149)
VLDL Cholesterol Cal: 15 mg/dL (ref 5–40)

## 2019-06-07 ENCOUNTER — Encounter (INDEPENDENT_AMBULATORY_CARE_PROVIDER_SITE_OTHER): Payer: Self-pay

## 2019-09-09 ENCOUNTER — Other Ambulatory Visit: Payer: Self-pay

## 2019-09-09 ENCOUNTER — Encounter (INDEPENDENT_AMBULATORY_CARE_PROVIDER_SITE_OTHER): Payer: Self-pay | Admitting: Primary Care

## 2019-09-09 ENCOUNTER — Ambulatory Visit (INDEPENDENT_AMBULATORY_CARE_PROVIDER_SITE_OTHER): Payer: Medicaid Other | Admitting: Primary Care

## 2019-09-09 VITALS — BP 114/74 | HR 70 | Temp 98.6°F | Resp 16 | Ht 64.0 in | Wt 195.0 lb

## 2019-09-09 DIAGNOSIS — O9279 Other disorders of lactation: Secondary | ICD-10-CM | POA: Diagnosis not present

## 2019-09-09 DIAGNOSIS — F418 Other specified anxiety disorders: Secondary | ICD-10-CM | POA: Diagnosis not present

## 2019-09-09 DIAGNOSIS — R1013 Epigastric pain: Secondary | ICD-10-CM

## 2019-09-09 DIAGNOSIS — Z3009 Encounter for other general counseling and advice on contraception: Secondary | ICD-10-CM

## 2019-09-09 NOTE — Patient Instructions (Addendum)
Discussed eating small frequent meal, reduction in acidic foods, fried foods ,spicy foods, alcohol caffeine and tobacco and certain medications. Avoid laying down after eating 20mins-1hour, elevated head of the bed.  Gastroesophageal Reflux Disease, Adult Gastroesophageal reflux (GER) happens when acid from the stomach flows up into the tube that connects the mouth and the stomach (esophagus). Normally, food travels down the esophagus and stays in the stomach to be digested. With GER, food and stomach acid sometimes move back up into the esophagus. You may have a disease called gastroesophageal reflux disease (GERD) if the reflux:  Happens often.  Causes frequent or very bad symptoms.  Causes problems such as damage to the esophagus. When this happens, the esophagus becomes sore and swollen (inflamed). Over time, GERD can make small holes (ulcers) in the lining of the esophagus. What are the causes? This condition is caused by a problem with the muscle between the esophagus and the stomach. When this muscle is weak or not normal, it does not close properly to keep food and acid from coming back up from the stomach. The muscle can be weak because of:  Tobacco use.  Pregnancy.  Having a certain type of hernia (hiatal hernia).  Alcohol use.  Certain foods and drinks, such as coffee, chocolate, onions, and peppermint. What increases the risk? You are more likely to develop this condition if you:  Are overweight.  Have a disease that affects your connective tissue.  Use NSAID medicines. What are the signs or symptoms? Symptoms of this condition include:  Heartburn.  Difficult or painful swallowing.  The feeling of having a lump in the throat.  A bitter taste in the mouth.  Bad breath.  Having a lot of saliva.  Having an upset or bloated stomach.  Belching.  Chest pain. Different conditions can cause chest pain. Make sure you see your doctor if you have chest  pain.  Shortness of breath or noisy breathing (wheezing).  Ongoing (chronic) cough or a cough at night.  Wearing away of the surface of teeth (tooth enamel).  Weight loss. How is this treated? Treatment will depend on how bad your symptoms are. Your doctor may suggest:  Changes to your diet.  Medicine.  Surgery. Follow these instructions at home: Eating and drinking   Follow a diet as told by your doctor. You may need to avoid foods and drinks such as: ? Coffee and tea (with or without caffeine). ? Drinks that contain alcohol. ? Energy drinks and sports drinks. ? Bubbly (carbonated) drinks or sodas. ? Chocolate and cocoa. ? Peppermint and mint flavorings. ? Garlic and onions. ? Horseradish. ? Spicy and acidic foods. These include peppers, chili powder, curry powder, vinegar, hot sauces, and BBQ sauce. ? Citrus fruit juices and citrus fruits, such as oranges, lemons, and limes. ? Tomato-based foods. These include red sauce, chili, salsa, and pizza with red sauce. ? Fried and fatty foods. These include donuts, french fries, potato chips, and high-fat dressings. ? High-fat meats. These include hot dogs, rib eye steak, sausage, ham, and bacon. ? High-fat dairy items, such as whole milk, butter, and cream cheese.  Eat small meals often. Avoid eating large meals.  Avoid drinking large amounts of liquid with your meals.  Avoid eating meals during the 2-3 hours before bedtime.  Avoid lying down right after you eat.  Do not exercise right after you eat. Lifestyle   Do not use any products that contain nicotine or tobacco. These include cigarettes, e-cigarettes, and chewing  tobacco. If you need help quitting, ask your doctor.  Try to lower your stress. If you need help doing this, ask your doctor.  If you are overweight, lose an amount of weight that is healthy for you. Ask your doctor about a safe weight loss goal. General instructions  Pay attention to any changes in  your symptoms.  Take over-the-counter and prescription medicines only as told by your doctor. Do not take aspirin, ibuprofen, or other NSAIDs unless your doctor says it is okay.  Wear loose clothes. Do not wear anything tight around your waist.  Raise (elevate) the head of your bed about 6 inches (15 cm).  Avoid bending over if this makes your symptoms worse.  Keep all follow-up visits as told by your doctor. This is important. Contact a doctor if:  You have new symptoms.  You lose weight and you do not know why.  You have trouble swallowing or it hurts to swallow.  You have wheezing or a cough that keeps happening.  Your symptoms do not get better with treatment.  You have a hoarse voice. Get help right away if:  You have pain in your arms, neck, jaw, teeth, or back.  You feel sweaty, dizzy, or light-headed.  You have chest pain or shortness of breath.  You throw up (vomit) and your throw-up looks like blood or coffee grounds.  You pass out (faint).  Your poop (stool) is bloody or black.  You cannot swallow, drink, or eat. Summary  If a person has gastroesophageal reflux disease (GERD), food and stomach acid move back up into the esophagus and cause symptoms or problems such as damage to the esophagus.  Treatment will depend on how bad your symptoms are.  Follow a diet as told by your doctor.  Take all medicines only as told by your doctor. This information is not intended to replace advice given to you by your health care provider. Make sure you discuss any questions you have with your health care provider. Document Revised: 08/09/2017 Document Reviewed: 08/09/2017 Elsevier Patient Education  2020 [employer].

## 2019-09-09 NOTE — Progress Notes (Signed)
Established Patient Office Visit  Subjective:  Patient ID: Jessica Bender, female    DOB: December 27, 1985  Age: 34 y.o. MRN: [id number]  CC: No chief complaint on file.   HPI Jessica Bender presents for abdominal pain not associated with food- empty or full.  Patient was taking advantage of and from result she has another child  no family in the country, no help now having to take care of two children and the baby is 78 months old.  She stopped nursing due to painful knots in her breast explained with interpreter mastitis.  Patient is requesting to have IUD placed.  Than not and feeling in her stomach underlining cause depression, anxiety with hopelessness.  Past Medical History:  Diagnosis Date  . Hearing loss    right ear-   . TB lung, latent    negative chest x-ray 2020    Past Surgical History:  Procedure Laterality Date  . NO PAST SURGERIES      History reviewed. No pertinent family history.  Social History   Socioeconomic History  . Marital status: Single    Spouse name: Not on file  . Number of children: Not on file  . Years of education: Not on file  . Highest education level: Not on file  Occupational History  . Not on file  Tobacco Use  . Smoking status: Never Smoker  . Smokeless tobacco: Never Used  Vaping Use  . Vaping Use: Never used  Substance and Sexual Activity  . Alcohol use: Never  . Drug use: Never  . Sexual activity: Yes    Birth control/protection: None  Other Topics Concern  . Not on file  Social History Narrative  . Not on file   Social Determinants of Health   Financial Resource Strain:   . Difficulty of Paying Living Expenses:   Food Insecurity:   . Worried About Programme researcher, broadcasting/film/video in the Last Year:   . Barista in the Last Year:   Transportation Needs:   . Freight forwarder (Medical):   Marland Kitchen Lack of Transportation (Non-Medical):   Physical Activity:   . Days of Exercise per Week:   . Minutes of Exercise  per Session:   Stress:   . Feeling of Stress :   Social Connections:   . Frequency of Communication with Friends and Family:   . Frequency of Social Gatherings with Friends and Family:   . Attends Religious Services:   . Active Member of Clubs or Organizations:   . Attends Banker Meetings:   Marland Kitchen Marital Status:   Intimate Partner Violence:   . Fear of Current or Ex-Partner:   . Emotionally Abused:   Marland Kitchen Physically Abused:   . Sexually Abused:     Outpatient Medications Prior to Visit  Medication Sig Dispense Refill  . ibuprofen (ADVIL) 600 MG tablet Take 1 tablet (600 mg total) by mouth every 8 (eight) hours as needed. (Patient not taking: Reported on 05/13/2019) 60 tablet 0  . norgestimate-ethinyl estradiol (ORTHO-CYCLEN) 0.25-35 MG-MCG tablet Take 1 tablet by mouth daily. 3 Package 4   No facility-administered medications prior to visit.    No Known Allergies  ROS Review of Systems  Psychiatric/Behavioral:       Depressed tearful causing daughter to be crying and concerned about her mother  All other systems reviewed and are negative.     Objective:    Physical Exam Vitals reviewed.  Constitutional:      Appearance:  She is obese.  HENT:     Head: Normocephalic.     Right Ear: Tympanic membrane normal.     Left Ear: Tympanic membrane normal.     Nose: Nose normal.  Eyes:     Extraocular Movements: Extraocular movements intact.     Pupils: Pupils are equal, round, and reactive to light.  Cardiovascular:     Rate and Rhythm: Normal rate and regular rhythm.     Pulses: Normal pulses.     Heart sounds: Normal heart sounds.  Pulmonary:     Effort: Pulmonary effort is normal.     Breath sounds: Normal breath sounds.  Abdominal:     General: Bowel sounds are normal. There is distension.  Musculoskeletal:     Cervical back: Normal range of motion.  Skin:    General: Skin is warm and dry.  Neurological:     Mental Status: She is alert and oriented to  person, place, and time.  Psychiatric:        Mood and Affect: Mood normal.     Comments: Sad crying     BP 114/74   Pulse 70   Temp 98.6 F (37 C)   Resp 16   Ht 5\' 4"  (1.626 m)   Wt 195 lb (88.5 kg)   LMP 09/07/2019   SpO2 99%   BMI 33.47 kg/m  Wt Readings from Last 3 Encounters:  09/09/19 195 lb (88.5 kg)  05/13/19 194 lb 9.6 oz (88.3 kg)  04/29/19 195 lb 9.6 oz (88.7 kg)     Health Maintenance Due  Topic Date Due  . Hepatitis C Screening  Never done  . COVID-19 Vaccine (1) Never done    There are no preventive care reminders to display for this patient.  No results found for: TSH Lab Results  Component Value Date   WBC 7.4 05/13/2019   HGB 14.4 05/13/2019   HCT 41.3 05/13/2019   MCV 89 05/13/2019   PLT 276 05/13/2019   Lab Results  Component Value Date   NA 139 05/13/2019   K 4.1 05/13/2019   CO2 23 05/13/2019   GLUCOSE 82 05/13/2019   BUN 12 05/13/2019   CREATININE 0.58 05/13/2019   BILITOT 0.4 05/13/2019   ALKPHOS 97 05/13/2019   AST 16 05/13/2019   ALT 19 05/13/2019   PROT 6.9 05/13/2019   ALBUMIN 4.0 05/13/2019   CALCIUM 10.0 05/13/2019   ANIONGAP 7 08/07/2018   Lab Results  Component Value Date   CHOL 214 (H) 05/13/2019   Lab Results  Component Value Date   HDL 52 05/13/2019   Lab Results  Component Value Date   LDLCALC 147 (H) 05/13/2019   Lab Results  Component Value Date   TRIG 84 05/13/2019   Lab Results  Component Value Date   CHOLHDL 4.1 05/13/2019   Lab Results  Component Value Date   HGBA1C 4.6 07/25/2018      Assessment & Plan:  Diagnoses and all orders for this visit:  Abdominal pain, epigastric Patient has stopped breast-feeding for 1 week due to engorgement and pain.  Milk remains present with squeezing of the nipples printed out documentation for relieving pain from the breast interpreter explained.  And advanced she returns to breast-feeding safe to take his Pepcid AC as needed for  indigestion  Depression with anxiety Schedule appointment with clinical social worker and community resources   Counseling for birth control regarding intrauterine device (IUD) Will refer to OB/GYN  Interruption in breast  feeding Due to engorgement and pain unclear if patient will return to breast-feeding.   No orders of the defined types were placed in this encounter.   Follow-up: Return CSW soon as possible.    Grayce Sessions, NP

## 2019-09-09 NOTE — Progress Notes (Signed)
Pt is here with Contract Interpreter [facility]ocelyne C /o stomach pains on and off not always associated with food intake

## 2019-09-24 ENCOUNTER — Institutional Professional Consult (permissible substitution) (INDEPENDENT_AMBULATORY_CARE_PROVIDER_SITE_OTHER): Payer: Medicaid Other | Admitting: Licensed Clinical Social Worker

## 2019-09-30 ENCOUNTER — Ambulatory Visit: Payer: Medicaid Other | Attending: Family Medicine | Admitting: Licensed Clinical Social Worker

## 2019-09-30 DIAGNOSIS — F439 Reaction to severe stress, unspecified: Secondary | ICD-10-CM

## 2019-10-16 NOTE — BH Specialist Note (Addendum)
Integrated Behavioral Health Initial Visit  MRN: [id number] Name: Noriko Macari  Number of Integrated Behavioral Health Clinician visits:: 1/6 Session Start time: 1:50 PM  Session End time: 2:03 PM Total time: 13  Type of Service: Integrated Behavioral Health- Individual  Interpretor:Yes.   Interpretor Name and Language: [employer] Interpreter-Kinyarwanda   SUBJECTIVE: Abriella Filkins is a 34 y.o. female accompanied by self Patient was referred by NP Edwards for depression and anxiety. Patient reports the following symptoms/concerns: Patient reports feeling sadness due to residing far away from family.  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 Duration of problem: Ongoing; Severity of problem: mild  OBJECTIVE: Mood: Anxious and Affect: Tearful Risk of harm to self or others: No plan to harm self or others  LIFE CONTEXT: Family and Social: Patient reports having friends who reside locally.  She speaks to family via FaceTime School/Work: Patient is employed Engineer, petroleum: Spends time with friends and workers and prioritizes sleep to manage mood Life Changes: Patient reports feelings of sadness due to not having family who resides at the country.  GOALS ADDRESSED: Patient will: 1. Reduce symptoms of: anxiety and depression 2. Increase knowledge and/or ability of: coping skills  3. Demonstrate ability to: Increase healthy adjustment to current life circumstances and Increase adequate support systems for patient/family  INTERVENTIONS: Interventions utilized: Supportive Counseling and Psychoeducation and/or Health Education  Standardized Assessments completed: Not Needed  ASSESSMENT: Patient currently experiencing episodes of sadness.  Patient reports family resides out of the country.  She speaks to them over the phone and over video messaging.  Patient denies thoughts of harming self or others.    Patient may benefit from linkage to  community resources to strengthen support system.  Encouragement provided. Pt spends time with friends to help manage feelings of loneliness.  Due to time restraints, LCSW was unable to provide printed supportive resources.  Patient is interested in birth control and was referred to OB/GYN.  LCSW will follow up with referral coordinator on referral, per patient request PLAN: 1. Follow up with behavioral health clinician on : LCSW will follow up with patient via phone 2. Behavioral recommendations: Continue healthy coping skills 3. Referral(s): Integrated Behavioral Health Services (In Clinic) 4. "From scale of 1-10, how likely are you to follow plan?":   Bridgett Larsson, LCSW 10/16/2019 5:55 AM

## 2019-12-05 ENCOUNTER — Telehealth: Payer: Self-pay | Admitting: Licensed Clinical Social Worker

## 2019-12-05 NOTE — Telephone Encounter (Signed)
Call placed to patient utilizing [employer] [facility] [phone]) A message was left reminding patient of upcoming appointment with OB/GYN, in addition, to their contact information.

## 2019-12-16 ENCOUNTER — Encounter: Payer: Medicaid Other | Admitting: Obstetrics & Gynecology

## 2020-01-02 IMAGING — US US MFM OB FOLLOW-UP
1 series · 14 of 28 positions shown · non-contrast
Comparison: none

[Series 1: us mfm ob follow-up · 45 acquisitions, 14 frames shown]
[im 2/45]
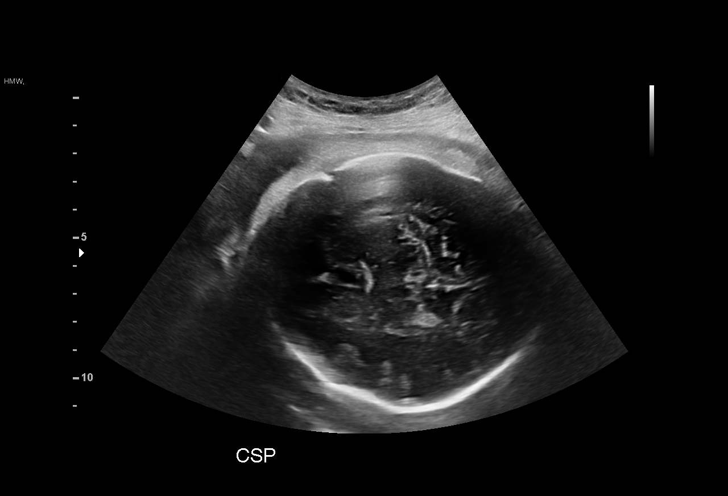
[im 5/45]
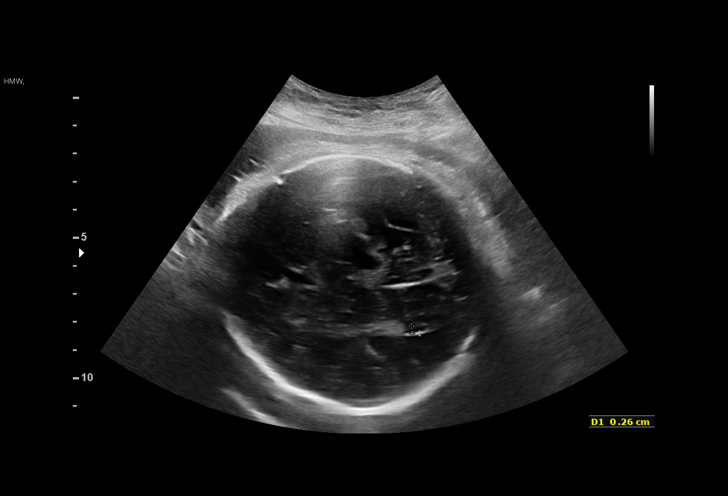
[im 9/45]
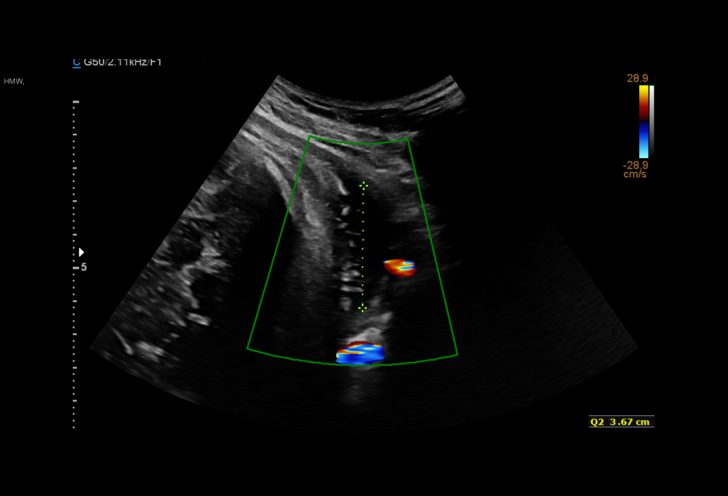
[im 12/45]
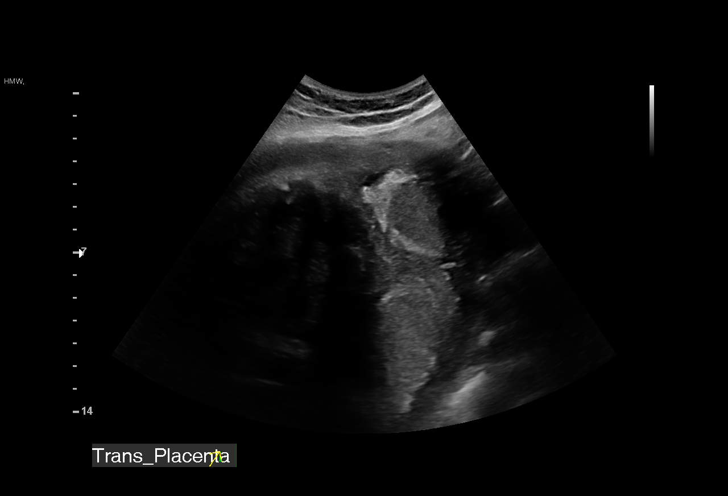
[im 15/45]
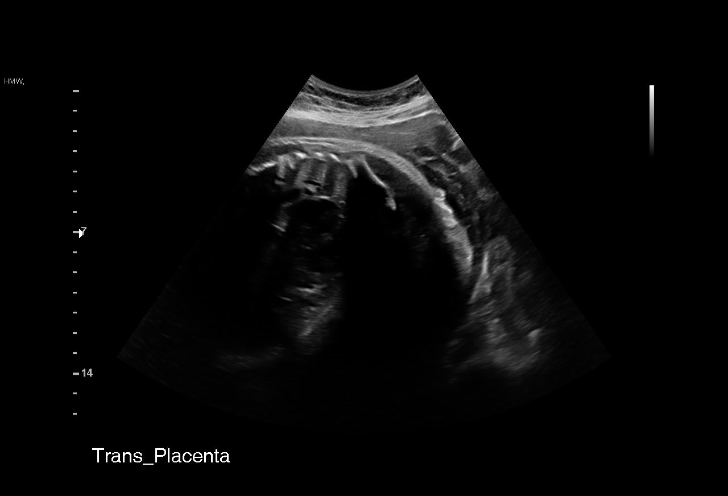
[im 18/45]
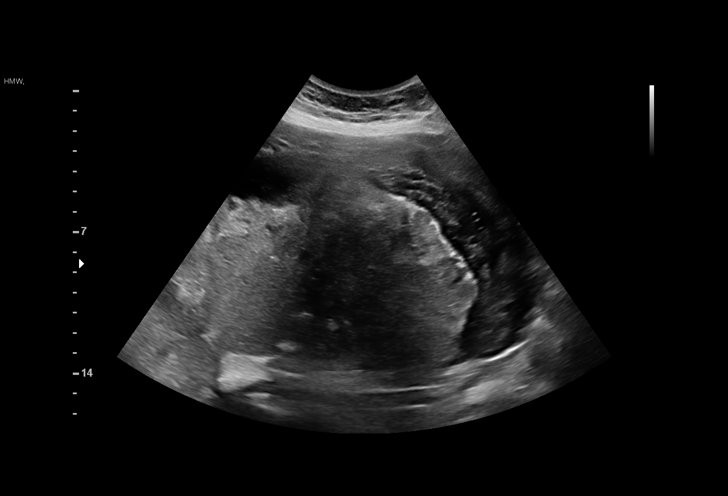
[im 22/45]
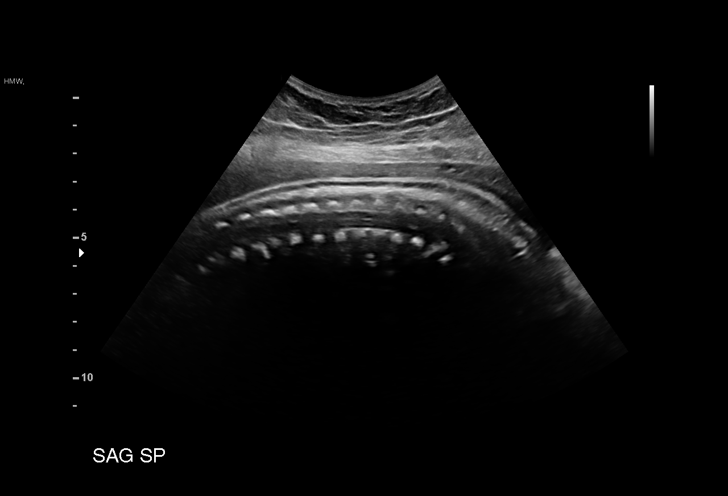
[im 25/45]
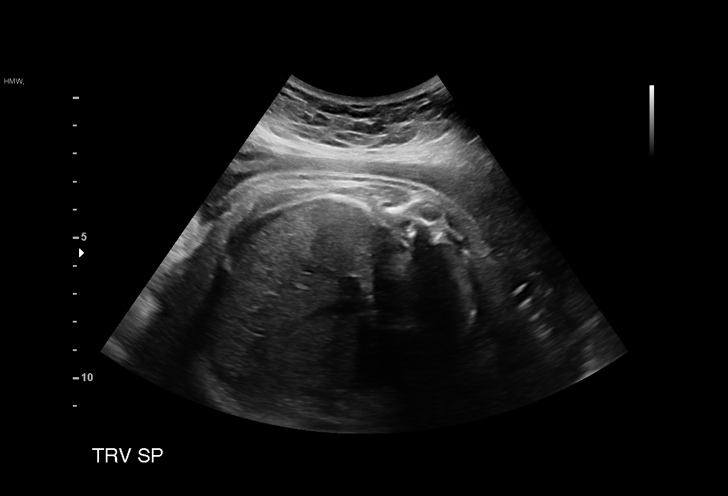
[im 28/45]
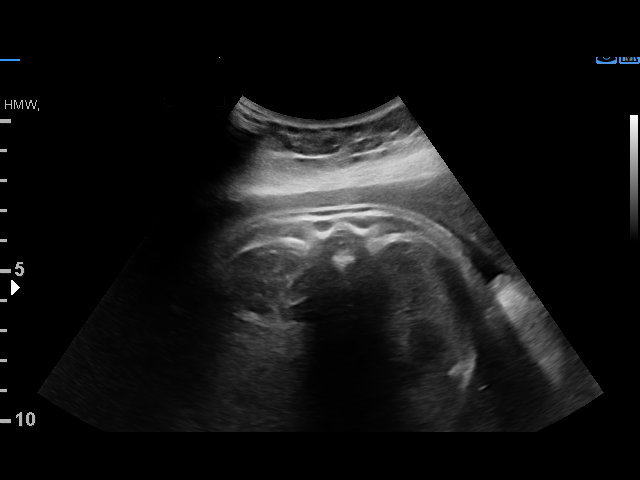
[im 31/45]
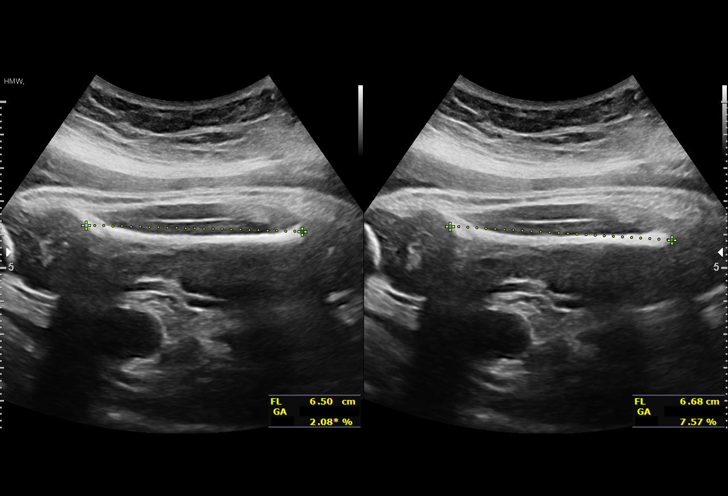
[im 35/45]
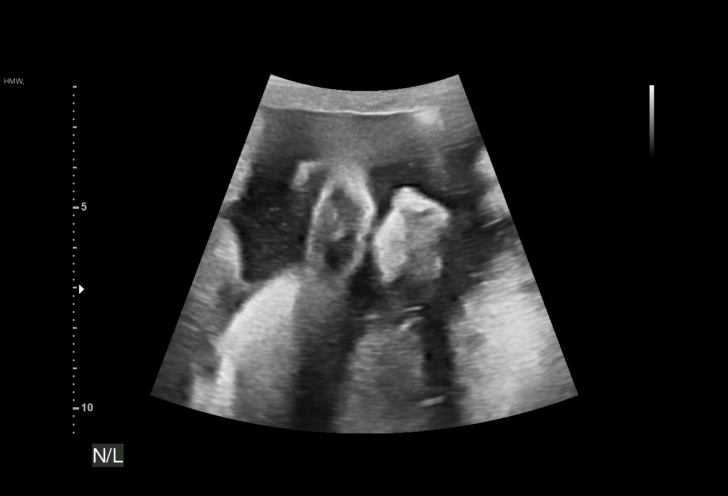
[im 38/45]
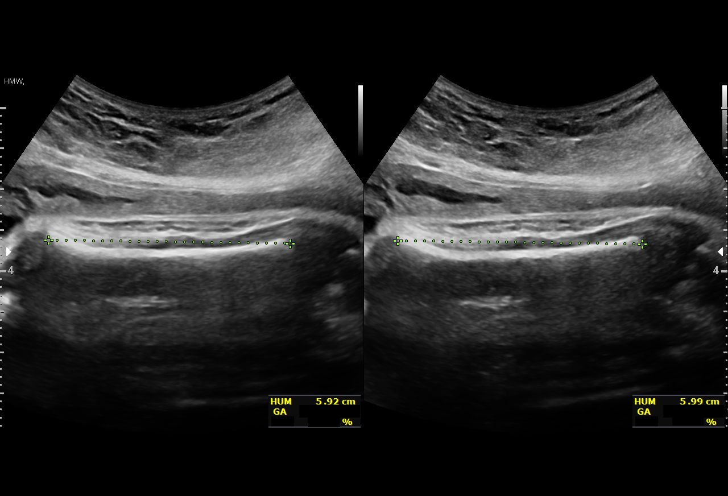
[im 41/45]
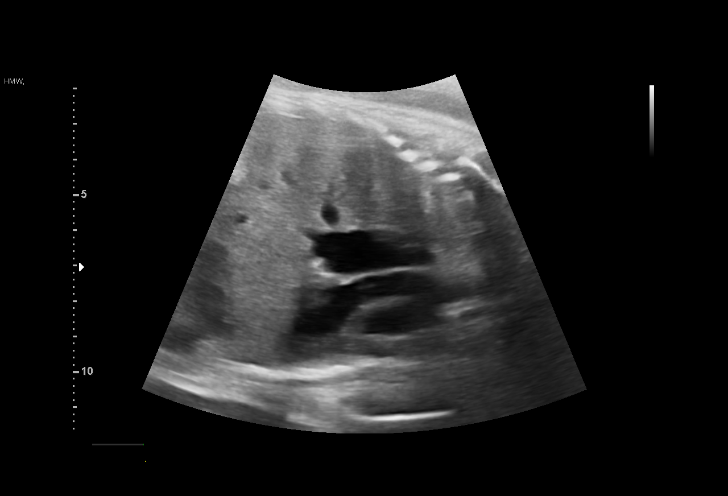
[im 45/45]
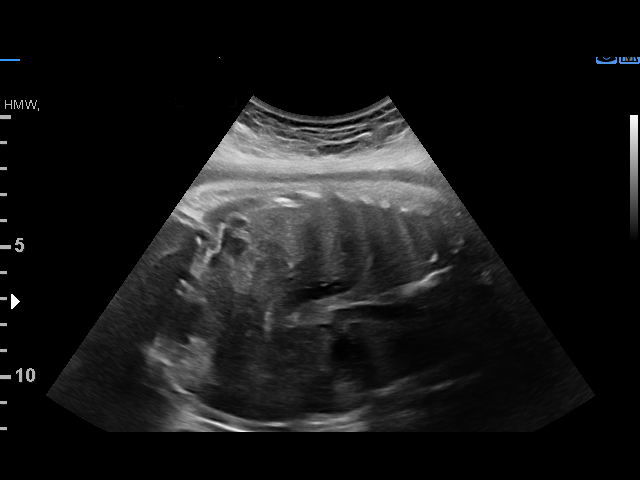

[14 of 28 positions shown; findings below may reference images not displayed]

[email or web address]

 ----------------------------------------------------------------------

 ----------------------------------------------------------------------
Indications

  36 weeks gestation of pregnancy
  Encounter for other antenatal screening
  follow-up (low risk NIPS, 35.6)
  Marginal insertion of umbilical cord affecting
  management of mother in third trimester
 ----------------------------------------------------------------------
Fetal Evaluation

 Num Of Fetuses:         1
 Fetal Heart Rate(bpm):  134
 Cardiac Activity:       Observed
 Presentation:           Cephalic
 Placenta:               Posterior
 P. Cord Insertion:      Marginal insertion prev seen

 Amniotic Fluid
 AFI FV:      Within normal limits

 AFI Sum(cm)     %Tile       Largest Pocket(cm)
 13.89           51

 RUQ(cm)       RLQ(cm)       LUQ(cm)        LLQ(cm)

Biometry

 BPD:        85  mm     G. Age:  34w 2d         10  %    CI:        74.62   %    70 - 86
                                                         FL/HC:      21.1   %    20.1 -
 HC:      312.3  mm     G. Age:  35w 0d          4  %    HC/AC:      0.93        0.93 -
 AC:      334.3  mm     G. Age:  37w 2d         86  %    FL/BPD:     77.5   %    71 - 87
 FL:       65.9  mm     G. Age:  34w 0d          4  %    FL/AC:      19.7   %    20 - 24
 HUM:      59.6  mm     G. Age:  34w 4d         34  %

 Est. FW:    3919  gm      6 lb 3 oz     41  %
OB History

 Gravidity:    5         Term:   3         SAB:   1
 Living:       3
Gestational Age

 U/S Today:     35w 1d                                        EDD:   12/09/18
 Best:          36w 2d     Det. By:  U/S  (06/12/18)          EDD:   12/01/18
Anatomy

 Cranium:               Appears normal         Aortic Arch:            Not well visualized
 Cavum:                 Previously seen        Ductal Arch:            Not well visualized
 Ventricles:            Appears normal         Diaphragm:              Appears normal
 Choroid Plexus:        Previously seen        Stomach:                Appears normal, left
                                                                       sided
 Cerebellum:            Previously seen        Abdomen:                Previously seen
 Posterior Fossa:       Previously seen        Abdominal Wall:         Previously seen
 Nuchal Fold:           Previously seen        Cord Vessels:           Previously seen
 Face:                  Not well visualized    Kidneys:                Appear normal
 Lips:                  Not well visualized    Bladder:                Appears normal
 Thoracic:              Appears normal         Spine:                  Appears normal
 Heart:                 Not well visualized    Upper Extremities:      Previously seen
 RVOT:                  Appears normal         Lower Extremities:      Previously seen
 LVOT:                  Appears normal

 Other:  Technically difficult due to early gestational age and fetal position.
Cervix Uterus Adnexa

 Cervix
 Not visualized (advanced GA >32wks)

 Uterus
 No abnormality visualized.

 Left Ovary
 No adnexal mass visualized.

 Right Ovary
 No adnexal mass visualized.

 Cul De Sac
 No free fluid seen.

 Adnexa
 No abnormality visualized.
Impression

 Normal interval growth.
 Suboptimal views of the fetal anatomy was again observed
 secondary to fetal position and gestational age.
 Ms. Holbert noted she had a completed anatomy
 exam in the second trimester, however, this was not
 documented in her record.
 She had a low risk cell free DNA
Recommendations

 Follow up as clinically indicated

## 2020-01-09 IMAGING — CR DG CHEST 1V
1 series · 1 of 1 positions shown · non-contrast
Comparison: None.

CLINICAL DATA: Positive PPD.  No current chest complaints.

EXAM:
CHEST  1 VIEW

[w chest pa]
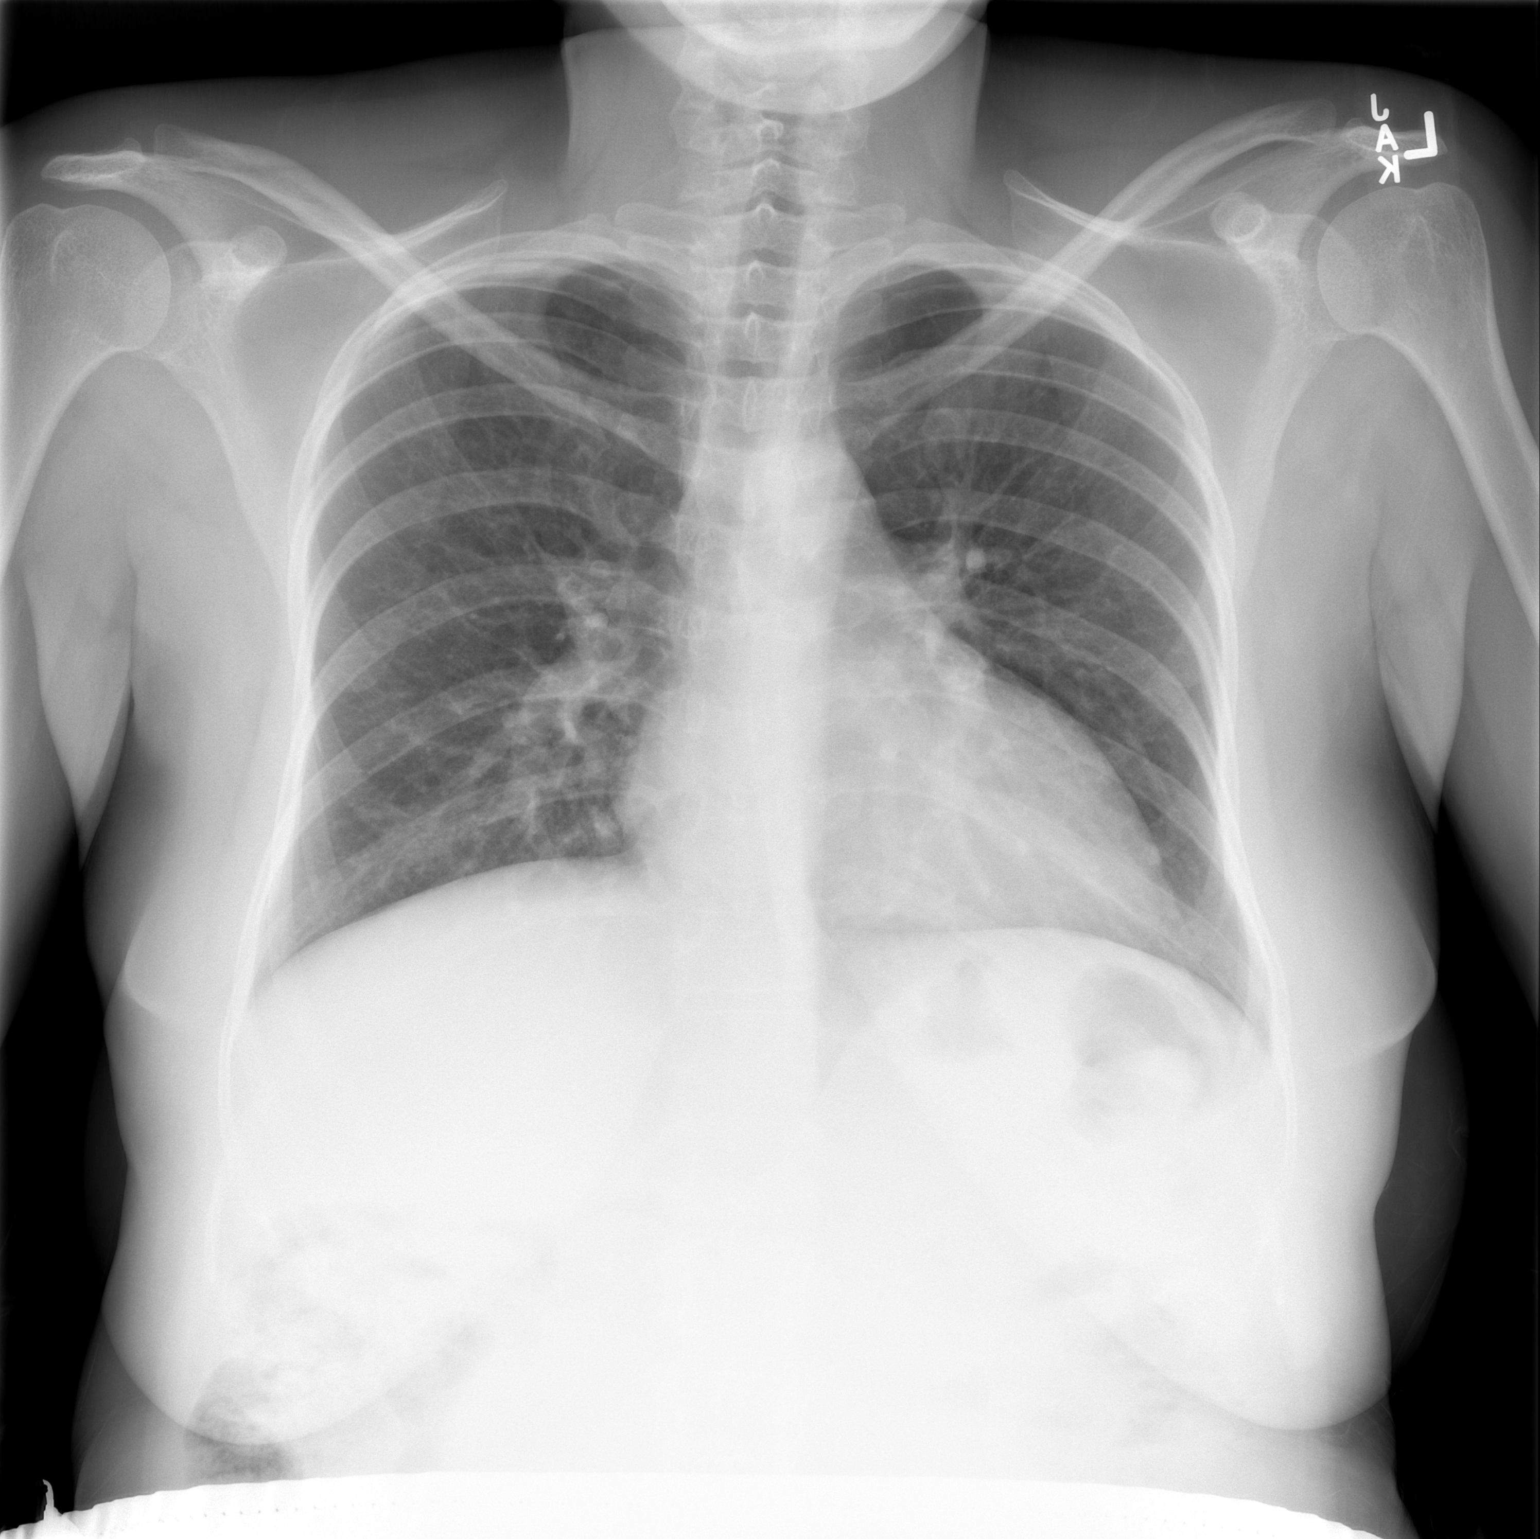

[1 of 1 positions shown; findings below may reference images not displayed]

FINDINGS: The heart size and mediastinal contours are normal. The lungs are
clear. There is no pleural effusion or pneumothorax. No acute
osseous findings are identified.
IMPRESSION: No active cardiopulmonary process. No radiographic evidence of
tuberculosis.

## 2020-02-15 NOTE — L&D Delivery Note (Signed)
Delivery Note AROM performed with clear fluid, patient complete and pushing. At 2:21 AM a viable female was delivered via Vaginal, Spontaneous (Presentation: Left Occiput Anterior).  APGAR: , ; weight 7 lb 8.1 oz (3405 g).   Placenta status: Spontaneous, Intact.  Cord: 3 vessels with the following complications: None. Cord avulsion at delivery, placenta delivered spontaneously.  Anesthesia: None Episiotomy: None Lacerations: None Est. Blood Loss (mL): 300  Mom to postpartum.  Baby to Couplet care / Skin to Skin.  [name] 08/29/2020, 3:01 AM

## 2020-03-30 ENCOUNTER — Emergency Department (HOSPITAL_BASED_OUTPATIENT_CLINIC_OR_DEPARTMENT_OTHER)
Admission: EM | Admit: 2020-03-30 | Discharge: 2020-03-30 | Disposition: A | Discharge status: Home / Self Care | Payer: Medicaid Other | Attending: Emergency Medicine | Admitting: Emergency Medicine

## 2020-03-30 ENCOUNTER — Other Ambulatory Visit: Payer: Self-pay

## 2020-03-30 DIAGNOSIS — R059 Cough, unspecified: Secondary | ICD-10-CM | POA: Diagnosis not present

## 2020-03-30 DIAGNOSIS — Z5321 Procedure and treatment not carried out due to patient leaving prior to being seen by health care provider: Secondary | ICD-10-CM | POA: Diagnosis not present

## 2020-03-30 NOTE — ED Triage Notes (Addendum)
Using interpreter phone line using Kinyarwanda interpreter [phone] pt states she was advised to come to this address to establish an OB doctor-pt advised she is in the ED-walked to elevator to view directory for OB/GYN in the building-pt states she does not have appt--pt advised she would need to call and establish appt as a new pt-pt agreeable-states she does not want to be seen as an ED pt-verified x 2-pt NAD-steady gait

## 2020-04-28 ENCOUNTER — Encounter: Payer: Self-pay | Admitting: Advanced Practice Midwife

## 2020-04-28 ENCOUNTER — Encounter: Payer: Medicaid Other | Admitting: Advanced Practice Midwife

## 2020-04-28 ENCOUNTER — Ambulatory Visit (INDEPENDENT_AMBULATORY_CARE_PROVIDER_SITE_OTHER): Payer: Medicaid Other | Admitting: Advanced Practice Midwife

## 2020-04-28 ENCOUNTER — Other Ambulatory Visit (HOSPITAL_COMMUNITY)
Admission: RE | Admit: 2020-04-28 | Discharge: 2020-04-28 | Disposition: A | Discharge status: Home / Self Care | Payer: Medicaid Other | Source: Ambulatory Visit | Attending: Advanced Practice Midwife | Admitting: Advanced Practice Midwife

## 2020-04-28 ENCOUNTER — Other Ambulatory Visit: Payer: Self-pay

## 2020-04-28 VITALS — BP 99/59 | HR 73 | Wt 178.0 lb

## 2020-04-28 DIAGNOSIS — Z3480 Encounter for supervision of other normal pregnancy, unspecified trimester: Secondary | ICD-10-CM | POA: Diagnosis not present

## 2020-04-28 DIAGNOSIS — O09529 Supervision of elderly multigravida, unspecified trimester: Secondary | ICD-10-CM

## 2020-04-28 DIAGNOSIS — Z3A21 21 weeks gestation of pregnancy: Secondary | ICD-10-CM

## 2020-04-28 NOTE — Progress Notes (Signed)
[employer] interpreter  Inperson interpretor present

## 2020-04-28 NOTE — Patient Instructions (Signed)

## 2020-04-28 NOTE — Progress Notes (Signed)
INITIAL OBSTETRICAL VISIT Patient name: Jessica Bender MRN [id number]  Date of birth: Apr 29, 1985 Chief Complaint:   Initial Prenatal Visit In person interpretor present  History of Present Illness:   Jessica Bender is a 35 y.o. [id number] African female at [redacted]w[redacted]d by unsure LMP with an Estimated Date of Delivery: 09/04/20 being seen today for her initial obstetrical visit.   Her obstetrical history is significant for advanced maternal age and Late to care.   Today she reports no complaints.  Depression screen [facility] 2/9 09/09/2019 09/09/2019 05/13/2019 04/29/2019 08/08/2018  Decreased Interest 0 0 0 0 1  Down, Depressed, Hopeless 0 0 0 0 1  PHQ - 2 Score 0 0 0 0 2  Altered sleeping 0 0 - - 1  Tired, decreased energy 0 0 - - 1  Change in appetite 0 0 - - 0  Feeling bad or failure about yourself  0 0 - - 0  Trouble concentrating 0 0 - - 0  Moving slowly or fidgety/restless 0 0 - - 0  Suicidal thoughts 0 0 - - 0  PHQ-9 Score 0 0 - - 4    Patient's last menstrual period was 11/29/2019. Last pap 2020 . Results were: abnormal  ASCUS Review of Systems:   Pertinent items are noted in HPI Denies cramping/contractions, leakage of fluid, vaginal bleeding, abnormal vaginal discharge w/ itching/odor/irritation, headaches, visual changes, shortness of breath, chest pain, abdominal pain, severe nausea/vomiting, or problems with urination or bowel movements unless otherwise stated above.  Pertinent History Reviewed:  Reviewed past medical,surgical, social, obstetrical and family history.  Reviewed problem list, medications and allergies. OB History  Gravida Para Term Preterm AB Living  6 4 4   1 4   SAB IAB Ectopic Multiple Live Births  1     0 4    # Outcome Date GA Lbr Len/2nd Weight Sex Delivery Anes PTL Lv  6 Current           5 Term 11/22/18 [redacted]w[redacted]d 08:10 / 00:29 7 lb 4.1 oz (3.291 kg) F Vag-Spont None  LIV  4 SAB 2019          3 Term 01/08/10   5 lb 8.2 oz (2.5 kg)  Vag-Spont None   LIV     Birth Comments: none  2 Term 01/16/07   6 lb 2.8 oz (2.8 kg)  Vag-Spont None  LIV     Birth Comments: none  1 Term 07/04/04   8 lb 13.1 oz (4 kg)  Vag-Spont None  LIV     Birth Comments: had bleeding in first few months   Physical Assessment:   Vitals:   04/28/20 0829  BP: (!) 99/59  Pulse: 73  Weight: 178 lb (80.7 kg)  Body mass index is 30.55 kg/m.       Physical Examination:  General appearance - well appearing, and in no distress  Mental status - alert, oriented to person, place, and time  Psych:  She has a normal mood and affect  Skin - warm and dry, normal color, no suspicious lesions noted  Chest - effort normal, all lung fields clear to auscultation bilaterally  Heart - normal rate and regular rhythm  Abdomen - soft, nontender  Extremities:  No swelling or varicosities noted  Pelvic - VULVA: normal appearing vulva with no masses, tenderness or lesions  VAGINA: normal appearing vagina with normal color and discharge, no lesions  CERVIX: normal appearing cervix without discharge or lesions, no CMT  Thin prep pap  is done  HR HPV cotesting  Chaperone: Armandina Stammer RN      No results found for this or any previous visit (from the past 24 hour(s)).  Assessment & Plan:  1) Low-Risk Pregnancy [id number] at [redacted]w[redacted]d with an Estimated Date of Delivery: 09/04/20   2) Initial OB visit  3) Hx ASCUS, pap repeated today  Meds:   Initial labs obtained Continue prenatal vitamins Reviewed n/v relief measures and warning s/s to report Reviewed recommended weight gain based on pre-gravid BMI Encouraged well-balanced diet Genetic & carrier screening discussed: requests Panorama, requests Panorama Ultrasound discussed; fetal survey: requested CCNC completed> form faxed if has or is planning to apply for medicaid The nature of [facility] [employer] with multiple MDs and other Advanced Practice Providers was explained to patient; also emphasized that  fellows, residents, and students are part of our team.    Indications for ASA therapy (per uptodate) One of the following: H/O preeclampsia, especially early onset/adverse outcome No Multifetal gestation No CHTN No T1DM or T2DM No Chronic kidney disease No Autoimmune disease (antiphospholipid syndrome, systemic lupus erythematosus) No  OR Two or more of the following: Nulliparity No Obesity (BMI>30 kg/m2) No Family h/o preeclampsia in mother or sister No Age ?35 years Yes Sociodemographic characteristics (African American race, low socioeconomic level) Yes Personal risk factors (eg, previous pregnancy w/ LBW or SGA, previous adverse pregnancy outcome [eg, stillbirth], interval >10 years between pregnancies) No  Indications for early A1C (per uptodate) BMI >=25 (>=23 in Asian women) AND one of the following GDM in a previous pregnancy No Previous A1C?5.7, impaired glucose tolerance, or impaired fasting glucose on previous testing No First-degree relative with diabetes No High-risk race/ethnicity (eg, African American, Latino, Native American, Asian American, Pacific Islander) Yes History of cardiovascular disease No HTN or on therapy for hypertension No HDL cholesterol level <35 mg/dL (3.71 mmol/L) and/or a triglyceride level >250 mg/dL (0.62 mmol/L) No PCOS No Physical inactivity No Other clinical condition associated with insulin resistance (eg, severe obesity, acanthosis nigricans) No Previous birth of an infant weighing ?4000 g No Previous stillbirth of unknown cause No >= 36yo No  Follow-up: Return in about 4 weeks (around 05/26/2020) for [employer].   Orders Placed This Encounter  Procedures  . Culture, OB Urine  . [place] MFM OB DETAIL +14 WK  . CBC/D/Plt+RPR+Rh+ABO+Rub Ab...  . Genetic Screening  . Panorama Prenatal Screen(Send Out)Scanned Report    Wynelle Bourgeois CNM, [facility] 04/28/2020 12:37 PM

## 2020-04-29 LAB — CBC/D/PLT+RPR+RH+ABO+RUB AB...
Antibody Screen: NEGATIVE
Basophils Absolute: 0 10*3/uL (ref 0.0–0.2)
Basos: 0 %
EOS (ABSOLUTE): 0 10*3/uL (ref 0.0–0.4)
Eos: 1 %
HCV Ab: <0.1 s/co ratio (ref 0.0–0.9)
HIV Screen 4th Generation wRfx: NONREACTIVE
Hematocrit: 36.9 % (ref 34.0–46.6)
Hemoglobin: 12.6 g/dL (ref 11.1–15.9)
Hepatitis B Surface Ag: NEGATIVE
Immature Grans (Abs): 0.1 10*3/uL (ref 0.0–0.1)
Immature Granulocytes: 1 %
Lymphocytes Absolute: 1.9 10*3/uL (ref 0.7–3.1)
Lymphs: 27 %
MCH: 32 pg (ref 26.6–33.0)
MCHC: 34.1 g/dL (ref 31.5–35.7)
MCV: 94 fL (ref 79–97)
Monocytes Absolute: 0.6 10*3/uL (ref 0.1–0.9)
Monocytes: 9 %
Neutrophils Absolute: 4.3 10*3/uL (ref 1.4–7.0)
Neutrophils: 62 %
Platelets: 228 10*3/uL (ref 150–450)
RBC: 3.94 x10E6/uL (ref 3.77–5.28)
RDW: 13.8 % (ref 11.7–15.4)
RPR Ser Ql: NONREACTIVE
Rh Factor: POSITIVE
Rubella Antibodies, IGG: 10.2 index (ref >0.99)
WBC: 6.8 10*3/uL (ref 3.4–10.8)

## 2020-04-29 LAB — HCV INTERPRETATION

## 2020-04-30 ENCOUNTER — Encounter: Payer: Self-pay | Admitting: Advanced Practice Midwife

## 2020-04-30 LAB — CYTOLOGY - PAP
Chlamydia: NEGATIVE
Comment: NEGATIVE
Comment: NEGATIVE
Comment: NORMAL
Diagnosis: UNDETERMINED — AB
High risk HPV: NEGATIVE
Neisseria Gonorrhea: NEGATIVE

## 2020-05-01 LAB — CULTURE, OB URINE

## 2020-05-01 LAB — URINE CULTURE, OB REFLEX

## 2020-05-05 ENCOUNTER — Telehealth: Payer: Self-pay

## 2020-05-05 NOTE — Telephone Encounter (Signed)
   Jessica Bender DOB: September 09, 1985 MRN: [id number]   RIDER WAIVER AND RELEASE OF LIABILITY  For purposes of improving physical access to our facilities, [facility] is pleased to partner with third parties to provide [place] patients or other authorized individuals the option of convenient, on-demand ground transportation services (the [employer]") through use of the technology service that enables users to request on-demand ground transportation from independent third-party providers.  By opting to use and accept these [employer], I, the undersigned, hereby agree on behalf of myself, and on behalf of any minor child using the [employer] for whom I am the parent or legal guardian, as follows:  1. Science writer provided to me are provided by independent third-party transportation providers who are not [employer] or employees and who are unaffiliated with [employer]. 2. [facility] is neither a transportation carrier nor a common or public carrier. 3. [facility] has no control over the quality or safety of the transportation that occurs as a result of the [employer]. 4. [facility] cannot guarantee that any third-party transportation provider will complete any arranged transportation service. 5. [facility] makes no representation, warranty, or guarantee regarding the reliability, timeliness, quality, safety, suitability, or availability of any of the Transport Services or that they will be error free. 6. I fully understand that traveling by vehicle involves risks and dangers of serious bodily injury, including permanent disability, paralysis, and death. I agree, on behalf of myself and on behalf of any minor child using the Transport Services for whom I am the parent or legal guardian, that the entire risk arising out of my use of the [employer] remains solely with me, to the maximum extent permitted under applicable law. 7. The  [employer] are provided "as is" and "as available." [facility] disclaims all representations and warranties, express, implied or statutory, not expressly set out in these terms, including the implied warranties of merchantability and fitness for a particular purpose. 8. I hereby waive and release [facility], its agents, employees, officers, directors, representatives, insurers, attorneys, assigns, successors, subsidiaries, and affiliates from any and all past, present, or future claims, demands, liabilities, actions, causes of action, or suits of any kind directly or indirectly arising from acceptance and use of the [employer]. 9. I further waive and release  and its affiliates from all present and future liability and responsibility for any injury or death to persons or damages to property caused by or related to the use of the [employer]. 10. I have read this Waiver and Release of Liability, and I understand the terms used in it and their legal significance. This Waiver is freely and voluntarily given with the understanding that my right (as well as the right of any minor child for whom I am the parent or legal guardian using the [employer]) to legal recourse against  in connection with the [employer] is knowingly surrendered in return for use of these services.   I attest that I read the consent document to Jessica Bender, gave [name] the opportunity to ask questions and answered the questions asked (if any). I affirm that Jessica Bender then provided consent for she's participation in this program.     Jessica Bender

## 2020-05-06 ENCOUNTER — Ambulatory Visit: Payer: Medicaid Other | Admitting: *Deleted

## 2020-05-06 ENCOUNTER — Other Ambulatory Visit: Payer: Self-pay | Admitting: *Deleted

## 2020-05-06 ENCOUNTER — Ambulatory Visit: Payer: Medicaid Other | Attending: Obstetrics and Gynecology

## 2020-05-06 ENCOUNTER — Encounter: Payer: Self-pay | Admitting: *Deleted

## 2020-05-06 ENCOUNTER — Other Ambulatory Visit: Payer: Self-pay

## 2020-05-06 DIAGNOSIS — Z362 Encounter for other antenatal screening follow-up: Secondary | ICD-10-CM

## 2020-05-06 DIAGNOSIS — Z363 Encounter for antenatal screening for malformations: Secondary | ICD-10-CM

## 2020-05-06 DIAGNOSIS — Z3480 Encounter for supervision of other normal pregnancy, unspecified trimester: Secondary | ICD-10-CM

## 2020-05-06 DIAGNOSIS — E669 Obesity, unspecified: Secondary | ICD-10-CM | POA: Diagnosis not present

## 2020-05-06 DIAGNOSIS — O09529 Supervision of elderly multigravida, unspecified trimester: Secondary | ICD-10-CM | POA: Diagnosis not present

## 2020-05-06 DIAGNOSIS — O0932 Supervision of pregnancy with insufficient antenatal care, second trimester: Secondary | ICD-10-CM

## 2020-05-06 DIAGNOSIS — O09522 Supervision of elderly multigravida, second trimester: Secondary | ICD-10-CM

## 2020-05-06 DIAGNOSIS — O99212 Obesity complicating pregnancy, second trimester: Secondary | ICD-10-CM | POA: Diagnosis not present

## 2020-05-06 DIAGNOSIS — Z3687 Encounter for antenatal screening for uncertain dates: Secondary | ICD-10-CM | POA: Diagnosis not present

## 2020-05-06 DIAGNOSIS — O321XX0 Maternal care for breech presentation, not applicable or unspecified: Secondary | ICD-10-CM

## 2020-05-06 DIAGNOSIS — Z3A22 22 weeks gestation of pregnancy: Secondary | ICD-10-CM

## 2020-05-26 ENCOUNTER — Encounter: Payer: Medicaid Other | Admitting: Advanced Practice Midwife

## 2020-06-05 ENCOUNTER — Ambulatory Visit (INDEPENDENT_AMBULATORY_CARE_PROVIDER_SITE_OTHER): Payer: Medicaid Other | Admitting: Family Medicine

## 2020-06-05 ENCOUNTER — Other Ambulatory Visit: Payer: Self-pay

## 2020-06-05 VITALS — BP 94/56 | HR 89 | Wt 184.0 lb

## 2020-06-05 DIAGNOSIS — Z3A27 27 weeks gestation of pregnancy: Secondary | ICD-10-CM

## 2020-06-05 DIAGNOSIS — Z789 Other specified health status: Secondary | ICD-10-CM

## 2020-06-05 DIAGNOSIS — Z3480 Encounter for supervision of other normal pregnancy, unspecified trimester: Secondary | ICD-10-CM

## 2020-06-05 DIAGNOSIS — Z227 Latent tuberculosis: Secondary | ICD-10-CM

## 2020-06-05 DIAGNOSIS — R87610 Atypical squamous cells of undetermined significance on cytologic smear of cervix (ASC-US): Secondary | ICD-10-CM

## 2020-06-05 NOTE — Progress Notes (Signed)
   PRENATAL VISIT NOTE  Subjective:  Jessica Bender is a 35 y.o. [id number] at [redacted]w[redacted]d being seen today for ongoing prenatal care.  She is currently monitored for the following issues for this low-risk pregnancy and has Language barrier; Positive TB test; Unilateral deafness, right; Latent tuberculosis; Social problem; ASCUS of cervix with negative high risk HPV; Blood pressure check; and Supervision of other normal pregnancy, antepartum on their problem list.  Patient reports no complaints.   .  .   . Denies leaking of fluid.   The following portions of the patient's history were reviewed and updated as appropriate: allergies, current medications, past family history, past medical history, past social history, past surgical history and problem list.   Objective:   Vitals:   06/05/20 0816  BP: (!) 94/56  Pulse: 89  Weight: 184 lb (83.5 kg)    Fetal Status: Fetal Heart Rate (bpm): 142         General:  Alert, oriented and cooperative. Patient is in no acute distress.  Skin: Skin is warm and dry. No rash noted.   Cardiovascular: Normal heart rate noted  Respiratory: Normal respiratory effort, no problems with respiration noted  Abdomen: Soft, gravid, appropriate for gestational age.        Pelvic: Cervical exam deferred        Extremities: Normal range of motion.     Mental Status: Normal mood and affect. Normal behavior. Normal judgment and thought content.   Assessment and Plan:  Pregnancy: [id number] at [redacted]w[redacted]d 1. [redacted] weeks gestation of pregnancy - CBC - Glucose Tolerance, 2 Hours w/1 Hour - HIV Antibody (routine testing w rflx) - RPR  2. Supervision of other normal pregnancy, antepartum FHT and FH normal - CBC - Glucose Tolerance, 2 Hours w/1 Hour - HIV Antibody (routine testing w rflx) - RPR  3. ASCUS of cervix with negative high risk HPV - CBC - Glucose Tolerance, 2 Hours w/1 Hour - HIV Antibody (routine testing w rflx) - RPR  4. Latent tuberculosis No active  infection - CBC - Glucose Tolerance, 2 Hours w/1 Hour - HIV Antibody (routine testing w rflx) - RPR  5. Language barrier Interpreter used - CBC - Glucose Tolerance, 2 Hours w/1 Hour - HIV Antibody (routine testing w rflx) - RPR  Preterm labor symptoms and general obstetric precautions including but not limited to vaginal bleeding, contractions, leaking of fluid and fetal movement were reviewed in detail with the patient. Please refer to After Visit Summary for other counseling recommendations.   No follow-ups on file.  Future Appointments  Date Time Provider Department Center  06/08/2020  1:00 PM [facility] NURSE [facility]  06/08/2020  1:15 PM [facility] US2 [facility]  06/19/2020 10:45 AM Levie Heritage, DO [facility] None  07/09/2020  9:30 AM Willodean Rosenthal, MD [facility] None  07/23/2020  9:30 AM Levie Heritage, DO [facility] None  08/06/2020  9:30 AM Levie Heritage, DO [facility] None  08/13/2020  9:30 AM Levie Heritage, DO [facility] None    Levie Heritage, DO

## 2020-06-06 LAB — CBC
Hematocrit: 36.6 % (ref 34.0–46.6)
Hemoglobin: 12.7 g/dL (ref 11.1–15.9)
MCH: 31.4 pg (ref 26.6–33.0)
MCHC: 34.7 g/dL (ref 31.5–35.7)
MCV: 90 fL (ref 79–97)
Platelets: 232 10*3/uL (ref 150–450)
RBC: 4.05 x10E6/uL (ref 3.77–5.28)
RDW: 12.4 % (ref 11.7–15.4)
WBC: 8 10*3/uL (ref 3.4–10.8)

## 2020-06-06 LAB — GLUCOSE TOLERANCE, 2 HOURS W/ 1HR
Glucose, 1 hour: 80 mg/dL (ref 65–179)
Glucose, 2 hour: 94 mg/dL (ref 65–152)
Glucose, Fasting: 72 mg/dL (ref 65–91)

## 2020-06-06 LAB — HIV ANTIBODY (ROUTINE TESTING W REFLEX): HIV Screen 4th Generation wRfx: NONREACTIVE

## 2020-06-06 LAB — RPR: RPR Ser Ql: NONREACTIVE

## 2020-06-08 ENCOUNTER — Other Ambulatory Visit: Payer: Self-pay

## 2020-06-08 ENCOUNTER — Ambulatory Visit: Payer: Medicaid Other | Admitting: *Deleted

## 2020-06-08 ENCOUNTER — Ambulatory Visit: Payer: Medicaid Other | Attending: Maternal & Fetal Medicine

## 2020-06-08 ENCOUNTER — Encounter: Payer: Self-pay | Admitting: *Deleted

## 2020-06-08 DIAGNOSIS — O09522 Supervision of elderly multigravida, second trimester: Secondary | ICD-10-CM

## 2020-06-08 DIAGNOSIS — Z363 Encounter for antenatal screening for malformations: Secondary | ICD-10-CM | POA: Diagnosis not present

## 2020-06-08 DIAGNOSIS — Z3480 Encounter for supervision of other normal pregnancy, unspecified trimester: Secondary | ICD-10-CM | POA: Insufficient documentation

## 2020-06-08 DIAGNOSIS — Z3A27 27 weeks gestation of pregnancy: Secondary | ICD-10-CM

## 2020-06-08 DIAGNOSIS — O99212 Obesity complicating pregnancy, second trimester: Secondary | ICD-10-CM | POA: Diagnosis not present

## 2020-06-08 DIAGNOSIS — E669 Obesity, unspecified: Secondary | ICD-10-CM

## 2020-06-08 DIAGNOSIS — Z362 Encounter for other antenatal screening follow-up: Secondary | ICD-10-CM | POA: Diagnosis present

## 2020-06-08 DIAGNOSIS — O0932 Supervision of pregnancy with insufficient antenatal care, second trimester: Secondary | ICD-10-CM

## 2020-06-19 ENCOUNTER — Ambulatory Visit (INDEPENDENT_AMBULATORY_CARE_PROVIDER_SITE_OTHER): Payer: Medicaid Other | Admitting: Family Medicine

## 2020-06-19 ENCOUNTER — Other Ambulatory Visit: Payer: Self-pay

## 2020-06-19 VITALS — BP 97/53 | HR 94 | Wt 187.1 lb

## 2020-06-19 DIAGNOSIS — Z227 Latent tuberculosis: Secondary | ICD-10-CM

## 2020-06-19 DIAGNOSIS — Z789 Other specified health status: Secondary | ICD-10-CM

## 2020-06-19 DIAGNOSIS — R87610 Atypical squamous cells of undetermined significance on cytologic smear of cervix (ASC-US): Secondary | ICD-10-CM

## 2020-06-19 DIAGNOSIS — Z3480 Encounter for supervision of other normal pregnancy, unspecified trimester: Secondary | ICD-10-CM

## 2020-06-19 DIAGNOSIS — O09529 Supervision of elderly multigravida, unspecified trimester: Secondary | ICD-10-CM | POA: Insufficient documentation

## 2020-06-19 MED ORDER — PREPLUS 27-1 MG PO TABS: 1.0000 | ORAL_TABLET | Freq: Every day | ORAL | 3 refills | Status: DC

## 2020-06-19 MED ORDER — ASPIRIN EC 81 MG PO TBEC: 81.0000 mg | DELAYED_RELEASE_TABLET | Freq: Every day | ORAL | 2 refills | Status: DC

## 2020-06-19 NOTE — Progress Notes (Signed)
   PRENATAL VISIT NOTE  Subjective:  Jessica Bender is a 35 y.o. [id number] at [redacted]w[redacted]d being seen today for ongoing prenatal care.  She is currently monitored for the following issues for this high-risk pregnancy and has Language barrier; Positive TB test; Unilateral deafness, right; Latent tuberculosis; Social problem; ASCUS of cervix with negative high risk HPV; Blood pressure check; Supervision of other normal pregnancy, antepartum; and AMA (advanced maternal age) multigravida 35+, unspecified trimester on their problem list.  Patient reports no complaints.  Contractions: Not present. Vag. Bleeding: None.  Movement: Present. Denies leaking of fluid.   The following portions of the patient's history were reviewed and updated as appropriate: allergies, current medications, past family history, past medical history, past social history, past surgical history and problem list.   Objective:   Vitals:   06/19/20 1055  BP: (!) 97/53  Pulse: 94  Weight: 187 lb 1.3 oz (84.9 kg)    Fetal Status:     Movement: Present     General:  Alert, oriented and cooperative. Patient is in no acute distress.  Skin: Skin is warm and dry. No rash noted.   Cardiovascular: Normal heart rate noted  Respiratory: Normal respiratory effort, no problems with respiration noted  Abdomen: Soft, gravid, appropriate for gestational age.  Pain/Pressure: Absent     Pelvic: Cervical exam deferred        Extremities: Normal range of motion.     Mental Status: Normal mood and affect. Normal behavior. Normal judgment and thought content.   Assessment and Plan:  Pregnancy: [id number] at [redacted]w[redacted]d 1. Supervision of other normal pregnancy, antepartum FHT and FH normal  2. AMA (advanced maternal age) multigravida 35+, unspecified trimester ASA 81mg   3. Language barrier Interpreter used  4. Latent tuberculosis No active disease  5. ASCUS of cervix with negative high risk HPV Neg HPV. Regular screening.  Preterm labor  symptoms and general obstetric precautions including but not limited to vaginal bleeding, contractions, leaking of fluid and fetal movement were reviewed in detail with the patient. Please refer to After Visit Summary for other counseling recommendations.   No follow-ups on file.  Future Appointments  Date Time Provider Department Center  07/09/2020  9:30 AM 07/11/2020, MD [facility] None  07/23/2020  9:30 AM 09/22/2020, DO [facility] None  08/06/2020  9:30 AM 08/08/2020, DO [facility] None  08/13/2020  9:30 AM 08/15/2020, DO [facility] None    Levie Heritage, DO

## 2020-07-09 ENCOUNTER — Ambulatory Visit (INDEPENDENT_AMBULATORY_CARE_PROVIDER_SITE_OTHER): Payer: Medicaid Other | Admitting: Obstetrics & Gynecology

## 2020-07-09 ENCOUNTER — Other Ambulatory Visit: Payer: Self-pay

## 2020-07-09 ENCOUNTER — Encounter: Payer: Self-pay | Admitting: Obstetrics & Gynecology

## 2020-07-09 VITALS — BP 103/62 | HR 99 | Wt 190.0 lb

## 2020-07-09 DIAGNOSIS — Z3480 Encounter for supervision of other normal pregnancy, unspecified trimester: Secondary | ICD-10-CM

## 2020-07-09 DIAGNOSIS — Z758 Other problems related to medical facilities and other health care: Secondary | ICD-10-CM

## 2020-07-09 DIAGNOSIS — R7611 Nonspecific reaction to tuberculin skin test without active tuberculosis: Secondary | ICD-10-CM

## 2020-07-09 DIAGNOSIS — O09529 Supervision of elderly multigravida, unspecified trimester: Secondary | ICD-10-CM

## 2020-07-09 DIAGNOSIS — Z789 Other specified health status: Secondary | ICD-10-CM

## 2020-07-09 DIAGNOSIS — R87610 Atypical squamous cells of undetermined significance on cytologic smear of cervix (ASC-US): Secondary | ICD-10-CM

## 2020-07-09 NOTE — Progress Notes (Signed)
   PRENATAL VISIT NOTE  Subjective:  Jessica Bender is a 35 y.o. [id number] at [redacted]w[redacted]d being seen today for ongoing prenatal care.  She is currently monitored for the following issues for this high-risk pregnancy and has Language barrier; Positive TB test; Unilateral deafness, right; Latent tuberculosis; Social problem; ASCUS of cervix with negative high risk HPV; Blood pressure check; Supervision of other normal pregnancy, antepartum; and AMA (advanced maternal age) multigravida 35+, unspecified trimester on their problem list.  Patient reports no complaints.  Contractions: Not present. Vag. Bleeding: None.  Movement: Present. Denies leaking of fluid.   The following portions of the patient's history were reviewed and updated as appropriate: allergies, current medications, past family history, past medical history, past social history, past surgical history and problem list.   Objective:   Vitals:   07/09/20 0926  BP: 103/62  Pulse: 99  Weight: 190 lb (86.2 kg)    Fetal Status: Fetal Heart Rate (bpm): 147   Movement: Present     General:  Alert, oriented and cooperative. Patient is in no acute distress.  Skin: Skin is warm and dry. No rash noted.   Cardiovascular: Normal heart rate noted  Respiratory: Normal respiratory effort, no problems with respiration noted  Abdomen: Soft, gravid, appropriate for gestational age.  Pain/Pressure: Absent     Pelvic: Cervical exam deferred        Extremities: Normal range of motion.  Edema: None  Mental Status: Normal mood and affect. Normal behavior. Normal judgment and thought content.   Assessment and Plan:  Pregnancy: [id number] at [redacted]w[redacted]d 1. Supervision of other normal pregnancy, antepartum No problems FH and FHR wnl  2. AMA (advanced maternal age) multigravida 35+, unspecified trimester  3. ASCUS of cervix with negative high risk HPV  4. Language barrier [place]  Video interpreter used for entire visit       5. Positive TB  test Latent disease. No active infection   Preterm labor symptoms and general obstetric precautions including but not limited to vaginal bleeding, contractions, leaking of fluid and fetal movement were reviewed in detail with the patient. Please refer to After Visit Summary for other counseling recommendations.   Return in about 2 weeks (around 07/23/2020) for in person.  Future Appointments  Date Time Provider Department Center  07/23/2020  9:30 AM Levie Heritage, DO [facility] None  08/06/2020  9:30 AM Levie Heritage, DO [facility] None  08/13/2020  9:30 AM Levie Heritage, DO [facility] None  08/20/2020  9:45 AM Levie Heritage, DO [facility] None  08/27/2020  9:15 AM Levie Heritage, DO [facility] None  09/03/2020  9:45 AM Adrian Blackwater, Rhona Raider, DO [facility] None    Willodean Rosenthal, MD

## 2020-07-09 NOTE — Progress Notes (Signed)
+   Fetal movement. No complaints.  

## 2020-07-23 ENCOUNTER — Ambulatory Visit (INDEPENDENT_AMBULATORY_CARE_PROVIDER_SITE_OTHER): Payer: Medicaid Other | Admitting: Family Medicine

## 2020-07-23 ENCOUNTER — Other Ambulatory Visit: Payer: Self-pay

## 2020-07-23 VITALS — BP 102/52 | HR 92 | Wt 190.0 lb

## 2020-07-23 DIAGNOSIS — Z3480 Encounter for supervision of other normal pregnancy, unspecified trimester: Secondary | ICD-10-CM

## 2020-07-23 DIAGNOSIS — Z3A33 33 weeks gestation of pregnancy: Secondary | ICD-10-CM

## 2020-07-23 DIAGNOSIS — Z227 Latent tuberculosis: Secondary | ICD-10-CM

## 2020-07-23 DIAGNOSIS — R87610 Atypical squamous cells of undetermined significance on cytologic smear of cervix (ASC-US): Secondary | ICD-10-CM

## 2020-07-23 DIAGNOSIS — Z789 Other specified health status: Secondary | ICD-10-CM

## 2020-07-23 DIAGNOSIS — O09529 Supervision of elderly multigravida, unspecified trimester: Secondary | ICD-10-CM

## 2020-07-23 NOTE — Progress Notes (Signed)
   PRENATAL VISIT NOTE  Subjective:  Jessica Bender is a 35 y.o. [id number] at [redacted]w[redacted]d being seen today for ongoing prenatal care.  She is currently monitored for the following issues for this low-risk pregnancy and has Language barrier; Positive TB test; Unilateral deafness, right; Latent tuberculosis; Social problem; ASCUS of cervix with negative high risk HPV; Blood pressure check; Supervision of other normal pregnancy, antepartum; and AMA (advanced maternal age) multigravida 35+, unspecified trimester on their problem list.  Patient reports  pain on left side from coughing .  Contractions: Not present. Vag. Bleeding: None.  Movement: Present. Denies leaking of fluid.   The following portions of the patient's history were reviewed and updated as appropriate: allergies, current medications, past family history, past medical history, past social history, past surgical history and problem list.   Objective:   Vitals:   07/23/20 0928  BP: (!) 102/52  Pulse: 92  Weight: 190 lb (86.2 kg)    Fetal Status: Fetal Heart Rate (bpm): 139   Movement: Present     General:  Alert, oriented and cooperative. Patient is in no acute distress.  Skin: Skin is warm and dry. No rash noted.   Cardiovascular: Normal heart rate noted  Respiratory: Normal respiratory effort, no problems with respiration noted  Abdomen: Soft, gravid, appropriate for gestational age.  Pain/Pressure: Absent     Pelvic: Cervical exam deferred        Extremities: Normal range of motion.  Edema: None  Mental Status: Normal mood and affect. Normal behavior. Normal judgment and thought content.   Assessment and Plan:  Pregnancy: [id number] at [redacted]w[redacted]d 1. [redacted] weeks gestation of pregnancy  2. Supervision of other normal pregnancy, antepartum FHT and FH normal  3. AMA (advanced maternal age) multigravida 35+, unspecified trimester On ASA 81mg   4. ASCUS of cervix with negative high risk HPV Regular screening  5. Language  barrier Interpreter used  6. Latent tuberculosis No active disease  Preterm labor symptoms and general obstetric precautions including but not limited to vaginal bleeding, contractions, leaking of fluid and fetal movement were reviewed in detail with the patient. Please refer to After Visit Summary for other counseling recommendations.   No follow-ups on file.  Future Appointments  Date Time Provider Department Center  08/06/2020  9:30 AM 08/08/2020, DO [facility] None  08/13/2020  9:30 AM 08/15/2020, DO [facility] None  08/20/2020  9:45 AM 10/21/2020, DO [facility] None  08/27/2020  9:15 AM 08/29/2020, DO [facility] None  09/03/2020  9:45 AM 09/05/2020, DO [facility] None    Levie Heritage, DO

## 2020-07-23 NOTE — Progress Notes (Signed)
+   fetal movement. Pt states she is having pain on the left side. States she is unable to sleep on the right side so she is having increased pain on the left side.

## 2020-08-06 ENCOUNTER — Ambulatory Visit (INDEPENDENT_AMBULATORY_CARE_PROVIDER_SITE_OTHER): Payer: Medicaid Other | Admitting: Family Medicine

## 2020-08-06 ENCOUNTER — Other Ambulatory Visit (HOSPITAL_COMMUNITY)
Admission: RE | Admit: 2020-08-06 | Discharge: 2020-08-06 | Disposition: A | Discharge status: Home / Self Care | Payer: Medicaid Other | Source: Ambulatory Visit | Attending: Family Medicine | Admitting: Family Medicine

## 2020-08-06 ENCOUNTER — Other Ambulatory Visit: Payer: Self-pay

## 2020-08-06 VITALS — BP 100/55 | HR 92 | Wt 192.0 lb

## 2020-08-06 DIAGNOSIS — Z3480 Encounter for supervision of other normal pregnancy, unspecified trimester: Secondary | ICD-10-CM

## 2020-08-06 DIAGNOSIS — R87610 Atypical squamous cells of undetermined significance on cytologic smear of cervix (ASC-US): Secondary | ICD-10-CM

## 2020-08-06 DIAGNOSIS — Z789 Other specified health status: Secondary | ICD-10-CM

## 2020-08-06 DIAGNOSIS — Z3A35 35 weeks gestation of pregnancy: Secondary | ICD-10-CM

## 2020-08-06 DIAGNOSIS — O09529 Supervision of elderly multigravida, unspecified trimester: Secondary | ICD-10-CM

## 2020-08-06 LAB — CERVICOVAGINAL ANCILLARY ONLY
Chlamydia: NEGATIVE
Comment: NEGATIVE
Comment: NORMAL
Neisseria Gonorrhea: NEGATIVE

## 2020-08-06 NOTE — Progress Notes (Signed)
   PRENATAL VISIT NOTE  Subjective:  Jessica Bender is a 35 y.o. [id number] at [redacted]w[redacted]d being seen today for ongoing prenatal care.  She is currently monitored for the following issues for this high-risk pregnancy and has Language barrier; Positive TB test; Unilateral deafness, right; Latent tuberculosis; Social problem; ASCUS of cervix with negative high risk HPV; Blood pressure check; Supervision of other normal pregnancy, antepartum; and AMA (advanced maternal age) multigravida 35+, unspecified trimester on their problem list.  Patient reports no complaints.  Contractions: Not present. Vag. Bleeding: None.  Movement: Present. Denies leaking of fluid.   The following portions of the patient's history were reviewed and updated as appropriate: allergies, current medications, past family history, past medical history, past social history, past surgical history and problem list.   Objective:   Vitals:   08/06/20 0937  BP: (!) 100/55  Pulse: 92  Weight: 192 lb (87.1 kg)    Fetal Status: Fetal Heart Rate (bpm): 135 Fundal Height: 36 cm Movement: Present  Presentation: Vertex  General:  Alert, oriented and cooperative. Patient is in no acute distress.  Skin: Skin is warm and dry. No rash noted.   Cardiovascular: Normal heart rate noted  Respiratory: Normal respiratory effort, no problems with respiration noted  Abdomen: Soft, gravid, appropriate for gestational age.  Pain/Pressure: Absent     Pelvic: Cervical exam deferred Dilation: Closed Effacement (%): Thick    Extremities: Normal range of motion.  Edema: None  Mental Status: Normal mood and affect. Normal behavior. Normal judgment and thought content.   Assessment and Plan:  Pregnancy: [id number] at [redacted]w[redacted]d 1. [redacted] weeks gestation of pregnancy  2. Supervision of other normal pregnancy, antepartum FHT and FH normal  3. AMA (advanced maternal age) multigravida 35+, unspecified trimester  4. ASCUS of cervix with negative high risk  HPV Normal screening  5. Language barrier Inpterpreter used  Preterm labor symptoms and general obstetric precautions including but not limited to vaginal bleeding, contractions, leaking of fluid and fetal movement were reviewed in detail with the patient. Please refer to After Visit Summary for other counseling recommendations.   No follow-ups on file.  Future Appointments  Date Time Provider Department Center  08/13/2020  9:30 AM Levie Heritage, DO [facility] None  08/20/2020  9:45 AM Levie Heritage, DO [facility] None  08/27/2020  9:15 AM Levie Heritage, DO [facility] None  09/03/2020  9:45 AM Levie Heritage, DO [facility] None    Levie Heritage, DO

## 2020-08-06 NOTE — Progress Notes (Signed)
Language Resources interpreter Jocelyne. 

## 2020-08-10 LAB — CULTURE, BETA STREP (GROUP B ONLY): Strep Gp B Culture: NEGATIVE

## 2020-08-13 ENCOUNTER — Other Ambulatory Visit: Payer: Self-pay

## 2020-08-13 ENCOUNTER — Ambulatory Visit (INDEPENDENT_AMBULATORY_CARE_PROVIDER_SITE_OTHER): Payer: Medicaid Other | Admitting: Family Medicine

## 2020-08-13 VITALS — BP 91/52 | HR 94 | Wt 194.0 lb

## 2020-08-13 DIAGNOSIS — R87610 Atypical squamous cells of undetermined significance on cytologic smear of cervix (ASC-US): Secondary | ICD-10-CM

## 2020-08-13 DIAGNOSIS — Z3A36 36 weeks gestation of pregnancy: Secondary | ICD-10-CM

## 2020-08-13 DIAGNOSIS — O09529 Supervision of elderly multigravida, unspecified trimester: Secondary | ICD-10-CM

## 2020-08-13 DIAGNOSIS — Z3480 Encounter for supervision of other normal pregnancy, unspecified trimester: Secondary | ICD-10-CM

## 2020-08-13 DIAGNOSIS — R7611 Nonspecific reaction to tuberculin skin test without active tuberculosis: Secondary | ICD-10-CM

## 2020-08-13 DIAGNOSIS — Z789 Other specified health status: Secondary | ICD-10-CM

## 2020-08-13 NOTE — Progress Notes (Signed)
+   Fetal movement. No complaints.  

## 2020-08-13 NOTE — Progress Notes (Signed)
   PRENATAL VISIT NOTE  Subjective:  Jessica Bender is a 35 y.o. [id number] at [redacted]w[redacted]d being seen today for ongoing prenatal care.  She is currently monitored for the following issues for this high-risk pregnancy and has Language barrier; Positive TB test; Unilateral deafness, right; Latent tuberculosis; Social problem; ASCUS of cervix with negative high risk HPV; Blood pressure check; Supervision of other normal pregnancy, antepartum; and AMA (advanced maternal age) multigravida 35+, unspecified trimester on their problem list.  Patient reports no complaints.  Contractions: Not present. Vag. Bleeding: None.  Movement: Present. Denies leaking of fluid.   The following portions of the patient's history were reviewed and updated as appropriate: allergies, current medications, past family history, past medical history, past social history, past surgical history and problem list.   Objective:   Vitals:   08/13/20 0925  BP: (!) 91/52  Pulse: 94  Weight: 194 lb (88 kg)    Fetal Status: Fetal Heart Rate (bpm): 139   Movement: Present     General:  Alert, oriented and cooperative. Patient is in no acute distress.  Skin: Skin is warm and dry. No rash noted.   Cardiovascular: Normal heart rate noted  Respiratory: Normal respiratory effort, no problems with respiration noted  Abdomen: Soft, gravid, appropriate for gestational age.  Pain/Pressure: Absent     Pelvic: Cervical exam deferred        Extremities: Normal range of motion.  Edema: None  Mental Status: Normal mood and affect. Normal behavior. Normal judgment and thought content.   Assessment and Plan:  Pregnancy: [id number] at [redacted]w[redacted]d 1. [redacted] weeks gestation of pregnancy  2. Supervision of other normal pregnancy, antepartum FHT and FH normal  3. AMA (advanced maternal age) multigravida 35+, unspecified trimester ASA 81mg   4. ASCUS of cervix with negative high risk HPV Routine PAP screening  5. Language barrier Interpreter  used  6. Positive TB test Latent TB  Preterm labor symptoms and general obstetric precautions including but not limited to vaginal bleeding, contractions, leaking of fluid and fetal movement were reviewed in detail with the patient. Please refer to After Visit Summary for other counseling recommendations.   No follow-ups on file.  Future Appointments  Date Time Provider Department Center  08/20/2020  9:45 AM 10/21/2020, DO [facility] None  08/27/2020  9:15 AM 08/29/2020, DO [facility] None  09/03/2020  9:45 AM 09/05/2020, DO [facility] None    Levie Heritage, DO

## 2020-08-20 ENCOUNTER — Ambulatory Visit (INDEPENDENT_AMBULATORY_CARE_PROVIDER_SITE_OTHER): Payer: Medicaid Other | Admitting: Family Medicine

## 2020-08-20 ENCOUNTER — Other Ambulatory Visit: Payer: Self-pay

## 2020-08-20 VITALS — BP 105/54 | HR 84 | Temp 98.4°F | Wt 194.0 lb

## 2020-08-20 DIAGNOSIS — Z3A37 37 weeks gestation of pregnancy: Secondary | ICD-10-CM

## 2020-08-20 DIAGNOSIS — Z3480 Encounter for supervision of other normal pregnancy, unspecified trimester: Secondary | ICD-10-CM

## 2020-08-20 DIAGNOSIS — Z227 Latent tuberculosis: Secondary | ICD-10-CM

## 2020-08-20 DIAGNOSIS — O09529 Supervision of elderly multigravida, unspecified trimester: Secondary | ICD-10-CM

## 2020-08-20 DIAGNOSIS — Z789 Other specified health status: Secondary | ICD-10-CM

## 2020-08-20 NOTE — Progress Notes (Signed)
   PRENATAL VISIT NOTE  Subjective:  Jessica Bender is a 35 y.o. [id number] at [redacted]w[redacted]d being seen today for ongoing prenatal care.  She is currently monitored for the following issues for this high-risk pregnancy and has Language barrier; Positive TB test; Unilateral deafness, right; Latent tuberculosis; Social problem; ASCUS of cervix with negative high risk HPV; Blood pressure check; Supervision of other normal pregnancy, antepartum; and AMA (advanced maternal age) multigravida 35+, unspecified trimester on their problem list.  Patient reports difficulty sleeping.  Contractions: Irritability. Vag. Bleeding: None.  Movement: Present. Denies leaking of fluid.   The following portions of the patient's history were reviewed and updated as appropriate: allergies, current medications, past family history, past medical history, past social history, past surgical history and problem list.   Objective:   Vitals:   08/20/20 0943  BP: (!) 105/54  Pulse: 84  Temp: 98.4 F (36.9 C)  Weight: 194 lb (88 kg)    Fetal Status: Fetal Heart Rate (bpm): 139 Fundal Height: 38 cm Movement: Present  Presentation: Vertex  General:  Alert, oriented and cooperative. Patient is in no acute distress.  Skin: Skin is warm and dry. No rash noted.   Cardiovascular: Normal heart rate noted  Respiratory: Normal respiratory effort, no problems with respiration noted  Abdomen: Soft, gravid, appropriate for gestational age.  Pain/Pressure: Present     Pelvic: Cervical exam deferred        Extremities: Normal range of motion.  Edema: None  Mental Status: Normal mood and affect. Normal behavior. Normal judgment and thought content.   Assessment and Plan:  Pregnancy: [id number] at [redacted]w[redacted]d  1. [redacted] weeks gestation of pregnancy  2. Supervision of other normal pregnancy, antepartum FHT and FH normal  3. Language barrier Interpreter used  4. Latent tuberculosis  5. AMA (advanced maternal age) multigravida 35+,  unspecified trimester ASA 81mg    Term labor symptoms and general obstetric precautions including but not limited to vaginal bleeding, contractions, leaking of fluid and fetal movement were reviewed in detail with the patient. Please refer to After Visit Summary for other counseling recommendations.   No follow-ups on file.  Future Appointments  Date Time Provider Department Center  08/27/2020  9:15 AM 08/29/2020, DO [facility] None  09/03/2020  9:45 AM 09/05/2020 Adrian Blackwater, DO [facility] None    Rhona Raider, DO

## 2020-08-20 NOTE — Progress Notes (Signed)
[employer].

## 2020-08-26 ENCOUNTER — Inpatient Hospital Stay (HOSPITAL_COMMUNITY)
Admission: AD | Admit: 2020-08-26 | Discharge: 2020-08-26 | Disposition: A | Discharge status: Home / Self Care | Payer: Medicaid Other | Attending: Obstetrics & Gynecology | Admitting: Obstetrics & Gynecology

## 2020-08-26 ENCOUNTER — Other Ambulatory Visit: Payer: Self-pay

## 2020-08-26 ENCOUNTER — Encounter (HOSPITAL_COMMUNITY): Payer: Self-pay | Admitting: Obstetrics & Gynecology

## 2020-08-26 DIAGNOSIS — O471 False labor at or after 37 completed weeks of gestation: Secondary | ICD-10-CM | POA: Insufficient documentation

## 2020-08-26 DIAGNOSIS — Z3A38 38 weeks gestation of pregnancy: Secondary | ICD-10-CM | POA: Diagnosis not present

## 2020-08-26 DIAGNOSIS — Z3480 Encounter for supervision of other normal pregnancy, unspecified trimester: Secondary | ICD-10-CM

## 2020-08-26 DIAGNOSIS — O09523 Supervision of elderly multigravida, third trimester: Secondary | ICD-10-CM | POA: Diagnosis not present

## 2020-08-26 DIAGNOSIS — Z3689 Encounter for other specified antenatal screening: Secondary | ICD-10-CM | POA: Diagnosis not present

## 2020-08-26 DIAGNOSIS — O479 False labor, unspecified: Secondary | ICD-10-CM

## 2020-08-26 NOTE — MAU Provider Note (Signed)
Per RN: [name] is a 36 y.o. [phone] at [redacted]w[redacted]d  who presents to MAU today complaining contractions q 15-30 minutes since last night. No VB or LOF. +FM.   O: BP 120/68 (BP Location: Left Arm)   Pulse 82   Temp 98 F (36.7 C) (Oral)   Resp 16   LMP 11/29/2019   SpO2 100%   Cervical exam:  Dilation: 1.5 Effacement (%): 50 Cervical Position: Posterior Station: -3 Presentation: Vertex Exam by:: n druebbisch rn  Fetal Monitoring: Baseline: 125 Variability: mod Accelerations: + Decelerations: no Contractions: irregular  A: SIUP at [redacted]w[redacted]d  False labor Reactive NST  P: Discharge home Follow up @[facility] as scheduled Labor precautions  , CNM 08/26/2020 5:35 PM

## 2020-08-26 NOTE — MAU Note (Signed)
Jessica Bender is a 35 y.o. at [redacted]w[redacted]d here in MAU reporting: contractions since midnight, they are every 15-30 minutes. Denies bleeding or LOF. +FM  Onset of complaint: today  Pain score: 4/10  Vitals:   08/26/20 1622  BP: 120/68  Pulse: 82  Resp: 16  Temp: 98 F (36.7 C)  SpO2: 100%     FHT: EFM applied in room  Lab orders placed from triage: none

## 2020-08-27 ENCOUNTER — Ambulatory Visit (INDEPENDENT_AMBULATORY_CARE_PROVIDER_SITE_OTHER): Payer: Medicaid Other | Admitting: Family Medicine

## 2020-08-27 VITALS — BP 101/61 | HR 98 | Wt 195.0 lb

## 2020-08-27 DIAGNOSIS — Z3A38 38 weeks gestation of pregnancy: Secondary | ICD-10-CM

## 2020-08-27 DIAGNOSIS — Z3480 Encounter for supervision of other normal pregnancy, unspecified trimester: Secondary | ICD-10-CM

## 2020-08-27 DIAGNOSIS — O09529 Supervision of elderly multigravida, unspecified trimester: Secondary | ICD-10-CM

## 2020-08-27 DIAGNOSIS — Z789 Other specified health status: Secondary | ICD-10-CM

## 2020-08-27 DIAGNOSIS — Z659 Problem related to unspecified psychosocial circumstances: Secondary | ICD-10-CM

## 2020-08-27 DIAGNOSIS — Z227 Latent tuberculosis: Secondary | ICD-10-CM

## 2020-08-27 NOTE — Progress Notes (Signed)
[place]

## 2020-08-27 NOTE — Progress Notes (Signed)
   PRENATAL VISIT NOTE  Subjective:  Jessica Bender is a 35 y.o. [id number] at [redacted]w[redacted]d being seen today for ongoing prenatal care.  She is currently monitored for the following issues for this low-risk pregnancy and has Language barrier; Positive TB test; Unilateral deafness, right; Latent tuberculosis; Social problem; ASCUS of cervix with negative high risk HPV; Blood pressure check; Supervision of other normal pregnancy, antepartum; and AMA (advanced maternal age) multigravida 35+, unspecified trimester on their problem list.  Patient reports no complaints.  Contractions: Irregular. Vag. Bleeding: None.  Movement: Present. Denies leaking of fluid.   The following portions of the patient's history were reviewed and updated as appropriate: allergies, current medications, past family history, past medical history, past social history, past surgical history and problem list.   Objective:   Vitals:   08/27/20 0906  BP: 101/61  Pulse: 98  Weight: 195 lb (88.5 kg)    Fetal Status: Fetal Heart Rate (bpm): 134 Fundal Height: 39 cm Movement: Present     General:  Alert, oriented and cooperative. Patient is in no acute distress.  Skin: Skin is warm and dry. No rash noted.   Cardiovascular: Normal heart rate noted  Respiratory: Normal respiratory effort, no problems with respiration noted  Abdomen: Soft, gravid, appropriate for gestational age.  Pain/Pressure: Present     Pelvic: Cervical exam deferred        Extremities: Normal range of motion.  Edema: None  Mental Status: Normal mood and affect. Normal behavior. Normal judgment and thought content.   Assessment and Plan:  Pregnancy: [id number] at [redacted]w[redacted]d 1. [redacted] weeks gestation of pregnancy  2. AMA (advanced maternal age) multigravida 35+, unspecified trimester ASA 81mg    3. Supervision of other normal pregnancy, antepartum FHT and FH normal  4. Social problem Pregnancy care coordinator involved with patient  5. Latent  tuberculosis  6. Language barrier Interpreter used   Term labor symptoms and general obstetric precautions including but not limited to vaginal bleeding, contractions, leaking of fluid and fetal movement were reviewed in detail with the patient. Please refer to After Visit Summary for other counseling recommendations.   No follow-ups on file.  Future Appointments  Date Time Provider Department Center  09/03/2020  9:45 AM 09/05/2020, DO [facility] None    Levie Heritage, DO

## 2020-08-29 ENCOUNTER — Encounter (HOSPITAL_COMMUNITY): Payer: Self-pay | Admitting: Obstetrics and Gynecology

## 2020-08-29 ENCOUNTER — Inpatient Hospital Stay (HOSPITAL_COMMUNITY)
Admission: AD | Admit: 2020-08-29 | Discharge: 2020-08-30 | DRG: 807 | Disposition: A | Discharge status: Home / Self Care | Payer: Medicaid Other | Attending: Obstetrics and Gynecology | Admitting: Obstetrics and Gynecology

## 2020-08-29 ENCOUNTER — Other Ambulatory Visit: Payer: Self-pay

## 2020-08-29 DIAGNOSIS — Z822 Family history of deafness and hearing loss: Secondary | ICD-10-CM | POA: Diagnosis not present

## 2020-08-29 DIAGNOSIS — O26893 Other specified pregnancy related conditions, third trimester: Secondary | ICD-10-CM | POA: Diagnosis present

## 2020-08-29 DIAGNOSIS — Z3A39 39 weeks gestation of pregnancy: Secondary | ICD-10-CM

## 2020-08-29 DIAGNOSIS — Z20822 Contact with and (suspected) exposure to covid-19: Secondary | ICD-10-CM | POA: Diagnosis present

## 2020-08-29 DIAGNOSIS — Z23 Encounter for immunization: Secondary | ICD-10-CM

## 2020-08-29 DIAGNOSIS — Z789 Other specified health status: Secondary | ICD-10-CM | POA: Diagnosis present

## 2020-08-29 DIAGNOSIS — Z30017 Encounter for initial prescription of implantable subdermal contraceptive: Secondary | ICD-10-CM

## 2020-08-29 DIAGNOSIS — O09529 Supervision of elderly multigravida, unspecified trimester: Secondary | ICD-10-CM

## 2020-08-29 DIAGNOSIS — R87610 Atypical squamous cells of undetermined significance on cytologic smear of cervix (ASC-US): Secondary | ICD-10-CM | POA: Diagnosis present

## 2020-08-29 DIAGNOSIS — Z8615 Personal history of latent tuberculosis infection: Secondary | ICD-10-CM | POA: Diagnosis not present

## 2020-08-29 DIAGNOSIS — H9191 Unspecified hearing loss, right ear: Secondary | ICD-10-CM | POA: Diagnosis present

## 2020-08-29 DIAGNOSIS — Z3480 Encounter for supervision of other normal pregnancy, unspecified trimester: Secondary | ICD-10-CM

## 2020-08-29 LAB — TYPE AND SCREEN
ABO/RH(D): O POS
Antibody Screen: NEGATIVE

## 2020-08-29 LAB — CBC
HCT: 39.1 % (ref 36.0–46.0)
Hemoglobin: 12.9 g/dL (ref 12.0–15.0)
MCH: 28.9 pg (ref 26.0–34.0)
MCHC: 33 g/dL (ref 30.0–36.0)
MCV: 87.5 fL (ref 80.0–100.0)
Platelets: 200 10*3/uL (ref 150–400)
RBC: 4.47 MIL/uL (ref 3.87–5.11)
RDW: 13.8 % (ref 11.5–15.5)
WBC: 9 10*3/uL (ref 4.0–10.5)
nRBC: 0 % (ref 0.0–0.2)

## 2020-08-29 LAB — RESP PANEL BY RT-PCR (FLU A&B, COVID) ARPGX2
Influenza A by PCR: NEGATIVE
Influenza B by PCR: NEGATIVE
SARS Coronavirus 2 by RT PCR: NEGATIVE

## 2020-08-29 LAB — RPR: RPR Ser Ql: NONREACTIVE

## 2020-08-29 MED ORDER — FENTANYL CITRATE (PF) 100 MCG/2ML IJ SOLN: 50.0000 ug | INTRAMUSCULAR | Status: DC | PRN

## 2020-08-29 MED ORDER — BENZOCAINE-MENTHOL 20-0.5 % EX AERO: 1.0000 "application " | INHALATION_SPRAY | CUTANEOUS | Status: DC | PRN

## 2020-08-29 MED ORDER — TRANEXAMIC ACID-NACL 1000-0.7 MG/100ML-% IV SOLN
INTRAVENOUS | Status: AC
  Administered 2020-08-29: 1000 mg
  Filled 2020-08-29: qty 100

## 2020-08-29 MED ORDER — DIPHENHYDRAMINE HCL 25 MG PO CAPS: 25.0000 mg | ORAL_CAPSULE | Freq: Four times a day (QID) | ORAL | Status: DC | PRN

## 2020-08-29 MED ORDER — OXYCODONE-ACETAMINOPHEN 5-325 MG PO TABS
1.0000 | ORAL_TABLET | ORAL | Status: DC | PRN
  Administered 2020-08-29: 1 via ORAL
  Filled 2020-08-29: qty 1

## 2020-08-29 MED ORDER — OXYCODONE-ACETAMINOPHEN 5-325 MG PO TABS: 2.0000 | ORAL_TABLET | ORAL | Status: DC | PRN

## 2020-08-29 MED ORDER — SIMETHICONE 80 MG PO CHEW
80.0000 mg | CHEWABLE_TABLET | ORAL | Status: DC | PRN
  Administered 2020-08-30: 80 mg via ORAL
  Filled 2020-08-29: qty 1

## 2020-08-29 MED ORDER — TETANUS-DIPHTH-ACELL PERTUSSIS 5-2.5-18.5 LF-MCG/0.5 IM SUSY
0.5000 mL | PREFILLED_SYRINGE | Freq: Once | INTRAMUSCULAR | Status: AC
  Administered 2020-08-30: 0.5 mL via INTRAMUSCULAR
  Filled 2020-08-29: qty 0.5

## 2020-08-29 MED ORDER — PRENATAL MULTIVITAMIN CH
1.0000 | ORAL_TABLET | Freq: Every day | ORAL | Status: DC
  Administered 2020-08-29 – 2020-08-30 (×2): 1 via ORAL
  Filled 2020-08-29 (×2): qty 1

## 2020-08-29 MED ORDER — ONDANSETRON HCL 4 MG/2ML IJ SOLN: 4.0000 mg | INTRAMUSCULAR | Status: DC | PRN

## 2020-08-29 MED ORDER — MEASLES, MUMPS & RUBELLA VAC IJ SOLR: 0.5000 mL | Freq: Once | INTRAMUSCULAR | Status: DC

## 2020-08-29 MED ORDER — DIBUCAINE (PERIANAL) 1 % EX OINT: 1.0000 "application " | TOPICAL_OINTMENT | CUTANEOUS | Status: DC | PRN

## 2020-08-29 MED ORDER — LACTATED RINGERS IV SOLN: INTRAVENOUS | Status: DC

## 2020-08-29 MED ORDER — SENNOSIDES-DOCUSATE SODIUM 8.6-50 MG PO TABS
2.0000 | ORAL_TABLET | ORAL | Status: DC
  Administered 2020-08-29: 2 via ORAL
  Filled 2020-08-29 (×2): qty 2

## 2020-08-29 MED ORDER — LACTATED RINGERS IV SOLN: 500.0000 mL | INTRAVENOUS | Status: DC | PRN

## 2020-08-29 MED ORDER — COCONUT OIL OIL: 1.0000 "application " | TOPICAL_OIL | Status: DC | PRN

## 2020-08-29 MED ORDER — ACETAMINOPHEN 325 MG PO TABS: 650.0000 mg | ORAL_TABLET | ORAL | Status: DC | PRN

## 2020-08-29 MED ORDER — ONDANSETRON HCL 4 MG PO TABS: 4.0000 mg | ORAL_TABLET | ORAL | Status: DC | PRN

## 2020-08-29 MED ORDER — IBUPROFEN 600 MG PO TABS
600.0000 mg | ORAL_TABLET | Freq: Four times a day (QID) | ORAL | Status: DC
  Administered 2020-08-29 – 2020-08-30 (×5): 600 mg via ORAL
  Filled 2020-08-29 (×5): qty 1

## 2020-08-29 MED ORDER — OXYTOCIN-SODIUM CHLORIDE 30-0.9 UT/500ML-% IV SOLN
INTRAVENOUS | Status: AC
  Filled 2020-08-29: qty 500

## 2020-08-29 MED ORDER — LIDOCAINE HCL (PF) 1 % IJ SOLN: 30.0000 mL | INTRAMUSCULAR | Status: DC | PRN

## 2020-08-29 MED ORDER — SOD CITRATE-CITRIC ACID 500-334 MG/5ML PO SOLN: 30.0000 mL | ORAL | Status: DC | PRN

## 2020-08-29 MED ORDER — ACETAMINOPHEN 325 MG PO TABS
650.0000 mg | ORAL_TABLET | ORAL | Status: DC | PRN
Start: 2020-08-29 — End: 2020-08-30
  Administered 2020-08-29 (×2): 650 mg via ORAL
  Filled 2020-08-29 (×2): qty 2

## 2020-08-29 MED ORDER — OXYTOCIN-SODIUM CHLORIDE 30-0.9 UT/500ML-% IV SOLN
2.5000 [IU]/h | INTRAVENOUS | Status: DC
  Administered 2020-08-29: 2.5 [IU]/h via INTRAVENOUS

## 2020-08-29 MED ORDER — OXYTOCIN BOLUS FROM INFUSION
333.0000 mL | Freq: Once | INTRAVENOUS | Status: AC
  Administered 2020-08-29: 333 mL via INTRAVENOUS

## 2020-08-29 MED ORDER — WITCH HAZEL-GLYCERIN EX PADS: 1.0000 "application " | MEDICATED_PAD | CUTANEOUS | Status: DC | PRN

## 2020-08-29 MED ORDER — ONDANSETRON HCL 4 MG/2ML IJ SOLN: 4.0000 mg | Freq: Four times a day (QID) | INTRAMUSCULAR | Status: DC | PRN

## 2020-08-29 NOTE — Discharge Summary (Signed)
Postpartum Discharge Summary     Patient Name: Jessica Bender DOB: 30-Aug-1985 MRN: [id number]  Date of admission: 08/29/2020 Delivery date:08/29/2020  Delivering provider: Arrie Senate  Date of discharge: 08/30/2020  Admitting diagnosis: Normal labor [O80, Z37.9] Intrauterine pregnancy: [redacted]w[redacted]d     Secondary diagnosis:  Active Problems:   Language barrier   Unilateral deafness, right   ASCUS of cervix with negative high risk HPV   Supervision of other normal pregnancy, antepartum   AMA (advanced maternal age) multigravida 35+, unspecified trimester   Normal labor   Vaginal delivery  Additional problems: none    Discharge diagnosis: Term Pregnancy Delivered                                              Post partum procedures:Nexplanon Augmentation: AROM Complications: None  Hospital course: Onset of Labor With Vaginal Delivery      35 y.o. yo [id number] at [redacted]w[redacted]d was admitted in Active Labor on 08/29/2020. Patient had an uncomplicated labor course as follows:  Membrane Rupture Time/Date: 1:47 AM ,08/29/2020   Delivery Method:Vaginal, Spontaneous  Episiotomy: None  Lacerations:  None  Patient had an uncomplicated postpartum course.  She is ambulating, tolerating a regular diet, passing flatus, and urinating well. Patient is discharged home in stable condition on 08/30/20.  Newborn Data: Birth date:08/29/2020  Birth time:2:21 AM  Gender:Female  Living status:Living  Apgars:9 ,9  Weight:3405 g   Magnesium Sulfate received: No BMZ received: No Rhophylac:N/A MMR:N/A T-DaP:administered prior to discharge Flu: N/A Transfusion:No  Physical exam  Vitals:   08/29/20 1630 08/29/20 2025 08/29/20 2105 08/30/20 0500  BP: 110/71 113/71 116/72 119/74  Pulse: 80 77  79  Resp: $Remo'18 18 18 18  'xPfFI$ Temp: 98.3 F (36.8 C) 98.1 F (36.7 C) 98 F (36.7 C) 98.1 F (36.7 C)  TempSrc: Oral Oral Oral Oral  SpO2: 97% 100% 99% 100%  Weight:      Height:       General: alert,  cooperative, and no distress Lochia: appropriate Uterine Fundus: firm Incision: N/A DVT Evaluation: No evidence of DVT seen on physical exam. No cords or calf tenderness. No significant calf/ankle edema. Labs: Lab Results  Component Value Date   WBC 9.0 08/29/2020   HGB 12.9 08/29/2020   HCT 39.1 08/29/2020   MCV 87.5 08/29/2020   PLT 200 08/29/2020   CMP Latest Ref Rng & Units 05/13/2019  Glucose 65 - 99 mg/dL 82  BUN 6 - 20 mg/dL 12  Creatinine 0.57 - 1.00 mg/dL 0.58  Sodium 134 - 144 mmol/L 139  Potassium 3.5 - 5.2 mmol/L 4.1  Chloride 96 - 106 mmol/L 104  CO2 20 - 29 mmol/L 23  Calcium 8.7 - 10.2 mg/dL 10.0  Total Protein 6.0 - 8.5 g/dL 6.9  Total Bilirubin 0.0 - 1.2 mg/dL 0.4  Alkaline Phos 39 - 117 IU/L 97  AST 0 - 40 IU/L 16  ALT 0 - 32 IU/L 19   Edinburgh Score: Edinburgh Postnatal Depression Scale Screening Tool 08/29/2020  I have been able to laugh and see the funny side of things. 0  I have looked forward with enjoyment to things. 0  I have blamed myself unnecessarily when things went wrong. 0  I have been anxious or worried for no good reason. 1  I have felt scared or panicky for no good reason.  0  Things have been getting on top of me. 0  I have been so unhappy that I have had difficulty sleeping. 1  I have felt sad or miserable. 1  I have been so unhappy that I have been crying. 1  The thought of harming myself has occurred to me. 0  Edinburgh Postnatal Depression Scale Total 4     After visit meds:  Allergies as of 08/30/2020   No Known Allergies      Medication List     STOP taking these medications    aspirin EC 81 MG tablet       TAKE these medications    acetaminophen 325 MG tablet Commonly known as: Tylenol Take 2 tablets (650 mg total) by mouth every 6 (six) hours as needed for mild pain, moderate pain, fever or headache (for pain scale < 4).   coconut oil Oil Apply 1 application topically as needed (nipple pain).   ibuprofen  600 MG tablet Commonly known as: ADVIL Take 1 tablet (600 mg total) by mouth every 8 (eight) hours as needed for moderate pain or cramping.   PrePLUS 27-1 MG Tabs Take 1 tablet by mouth daily.         Discharge home in stable condition Infant Feeding: Breast Infant Disposition:home with mother Discharge instruction: per After Visit Summary and Postpartum booklet. Activity: Advance as tolerated. Pelvic rest for 6 weeks.  Diet: routine diet Future Appointments: Future Appointments  Date Time Provider [place]  09/03/2020  9:45 AM Truett Mainland, DO [facility] None  09/11/2020  9:30 AM Lavonia Drafts, MD [facility] None   Follow up Visit: Message sent to [facility] 08/29/20 by Sylvester Harder.   Please schedule this patient for a In person postpartum visit in 6 weeks with the following provider: Any provider. Additional Postpartum F/U: none   Low risk pregnancy complicated by:  n/a Delivery mode:  Vaginal, Spontaneous  Anticipated Birth Control:  PP Nexplanon placed  Fallen Crisostomo, Gildardo Cranker, MD OB Fellow, Faculty Practice 08/30/2020 10:32 AM

## 2020-08-29 NOTE — Plan of Care (Signed)
  Problem: Education: Goal: Knowledge of Childbirth will improve Outcome: Completed/Met Goal: Ability to make informed decisions regarding treatment and plan of care will improve Outcome: Adequate for Discharge Goal: Ability to state and carry out methods to decrease the pain will improve Outcome: Adequate for Discharge Goal: Individualized Educational Video(s) Outcome: Not Applicable   Problem: Coping: Goal: Ability to verbalize concerns and feelings about labor and delivery will improve Outcome: Adequate for Discharge   Problem: Life Cycle: Goal: Ability to make normal progression through stages of labor will improve Outcome: Completed/Met Goal: Ability to effectively push during vaginal delivery will improve Outcome: Completed/Met

## 2020-08-29 NOTE — H&P (Signed)
OBSTETRIC ADMISSION HISTORY AND PHYSICAL  Jessica Bender is a 35 y.o. female [phone] with IUP at [redacted]w[redacted]d by LMP presenting for SOL. She reports +FMs, No LOF, no VB, no blurry vision, headaches or peripheral edema, and RUQ pain.  She plans on breast feeding. She request nexplanon for birth control. She received her prenatal care at  [facility]    Dating: By LMP --->  Estimated Date of Delivery: 09/04/20  Sono:    06/08/20@[redacted]w[redacted]d , CWD, normal anatomy, cephalic presentation, anterior placental lie, 1160g, 61% EFW   Prenatal History/Complications:  AMA Language barrier Abnormal pap (ASCUS, neg hpv)  Past Medical History: Past Medical History:  Diagnosis Date   Hearing loss    right ear-    TB lung, latent    negative chest x-ray 2020    Past Surgical History: Past Surgical History:  Procedure Laterality Date   NO PAST SURGERIES      Obstetrical History: OB History     Gravida  6   Para  4   Term  4   Preterm      AB  1   Living  4      SAB  1   IAB      Ectopic      Multiple  0   Live Births  4           Social History Social History   Socioeconomic History   Marital status: Single    Spouse name: Not on file   Number of children: Not on file   Years of education: Not on file   Highest education level: Not on file  Occupational History   Not on file  Tobacco Use   Smoking status: Never   Smokeless tobacco: Never  Vaping Use   Vaping Use: Never used  Substance and Sexual Activity   Alcohol use: Never   Drug use: Never   Sexual activity: Yes    Birth control/protection: None  Other Topics Concern   Not on file  Social History Narrative   Not on file   Social Determinants of Health   Financial Resource Strain: Not on file  Food Insecurity: Not on file  Transportation Needs: Not on file  Physical Activity: Not on file  Stress: Not on file  Social Connections: Not on file    Family History: No family history on  file.  Allergies: No Known Allergies  Medications Prior to Admission  Medication Sig Dispense Refill Last Dose   aspirin EC 81 MG tablet Take 1 tablet (81 mg total) by mouth daily. Take after 12 weeks for prevention of preeclampsia later in pregnancy 300 tablet 2    Prenatal Vit-Fe Fumarate-FA (PREPLUS) 27-1 MG TABS Take 1 tablet by mouth daily. 90 tablet 3      Review of Systems   All systems reviewed and negative except as stated in HPI  Last menstrual period 11/29/2019, currently breastfeeding. General appearance: alert, cooperative, and no distress Lungs: normal respiratory effort Heart: regular rate and rhythm Abdomen: soft, non-tender; gravid Pelvic: as noted below Extremities: Homans sign is negative, no sign of DVT Presentation: cephalic by MAU RN exam Fetal monitoring baseline 140bpm, mod variability, +accels, intermittent late and variable decels with contractions Uterine activityFrequency: Every 1-3 minutes     Prenatal labs: ABO, Rh: O/Positive/-- (03/15 0935) Antibody: Negative (03/15 0935) Rubella: 10.20 (03/15 0935) RPR: Non Reactive (04/22 0843)  HBsAg: Negative (03/15 0935)  HIV: Non Reactive (04/22 0843)  GBS: Negative/-- (06/23 1029)  2 hr Glucola passed Genetic screening  normal Anatomy US normal  Prenatal Transfer Tool  Maternal Diabetes: No Genetic Screening: Normal Maternal Ultrasounds/Referrals: Normal Fetal Ultrasounds or other Referrals:  None Maternal Substance Abuse:  No Significant Maternal Medications:  None Significant Maternal Lab Results: Group B Strep negative  No results found for this or any previous visit (from the past 24 hour(s)).  Patient Active Problem List   Diagnosis Date Noted   AMA (advanced maternal age) multigravida 35+, unspecified trimester 06/19/2020   Supervision of other normal pregnancy, antepartum 04/28/2020   ASCUS of cervix with negative high risk HPV 01/16/2019   Social problem 12/31/2018   Latent  tuberculosis 11/22/2018   Unilateral deafness, right 06/11/2015   Language barrier 01/07/2015   Positive TB test 01/07/2015    Assessment/Plan:  Jessica Bender is a 35 y.o. [id number] at [redacted]w[redacted]d here for SOL.  #Labor: Continue expectant management.  #Pain: PRN #FWB: Cat 2, will administer LR bolus, attempt position changes, and consider AROM with internals if unimproved  #ID: GBS neg #MOF: breast #MOC: nexplanon #Circ: yes #Language barrier: stratus interpreter used for entirety of visit  [name], MD  08/29/2020, 1:26 AM

## 2020-08-29 NOTE — MAU Note (Signed)
Pt reports ctx started yesterday, denies VB and LOF. Reports +FM.

## 2020-08-30 ENCOUNTER — Encounter (HOSPITAL_COMMUNITY): Payer: Self-pay | Admitting: *Deleted

## 2020-08-30 DIAGNOSIS — Z30017 Encounter for initial prescription of implantable subdermal contraceptive: Secondary | ICD-10-CM | POA: Diagnosis not present

## 2020-08-30 MED ORDER — ETONOGESTREL 68 MG ~~LOC~~ IMPL
68.0000 mg | DRUG_IMPLANT | Freq: Once | SUBCUTANEOUS | Status: AC
  Administered 2020-08-30: 68 mg via SUBCUTANEOUS
  Filled 2020-08-30: qty 1

## 2020-08-30 MED ORDER — LIDOCAINE HCL 1 % IJ SOLN
0.0000 mL | Freq: Once | INTRAMUSCULAR | Status: AC | PRN
  Administered 2020-08-30: 20 mL via INTRADERMAL
  Filled 2020-08-30: qty 20

## 2020-08-30 MED ORDER — COCONUT OIL OIL: 1.0000 "application " | TOPICAL_OIL | 0 refills | Status: DC | PRN

## 2020-08-30 MED ORDER — ACETAMINOPHEN 325 MG PO TABS: 650.0000 mg | ORAL_TABLET | Freq: Four times a day (QID) | ORAL | 1 refills | Status: DC | PRN

## 2020-08-30 MED ORDER — IBUPROFEN 600 MG PO TABS: 600.0000 mg | ORAL_TABLET | Freq: Three times a day (TID) | ORAL | 0 refills | Status: DC | PRN

## 2020-08-30 NOTE — Progress Notes (Signed)
Discharge education reviewed with patient using telephone interpreter "[place], code # [phone]." Questions answered and patient ready to be discharged. Earl Gala, Linda Hedges [place]

## 2020-08-30 NOTE — Procedures (Signed)
Post-Placental Nexplanon Insertion Procedure Note  Patient was identified. Informed consent was signed, signed copy in chart. A time-out was performed.    The insertion site was identified 8-10 cm (3-4 inches) from the medial epicondyle of the humerus and 3-5 cm (1.25-2 inches) posterior to (below) the sulcus (groove) between the biceps and triceps muscles of the patient's left arm and marked. The site was prepped and draped in the usual sterile fashion. Pt was prepped with alcohol swab and then injected with 4 cc of 1% lidocaine. The site was prepped with betadine. Nexplanon removed form packaging,  Device confirmed in needle, then inserted full length of needle and withdrawn per handbook instructions. Provider and patient verified presence of the implant in the woman's arm by palpation. Pt insertion site was covered with steristrips/adhesive bandage and pressure bandage. There was minimal blood loss. Patient tolerated procedure well.  Patient was given post procedure instructions and Nexplanon user card with expiration date. Condoms were recommended for STI prevention. Patient was asked to keep the pressure dressing on for 24 hours to minimize bruising and keep the adhesive bandage on for 3-5 days. The patient verbalized understanding of the plan of care and agrees.   Lot # [id number] Expiration Date 2024 Jul 21  Barb Merino Skipper Cliche, MD [facility] Fellow, Faculty Practice 08/30/2020 10:01 AM

## 2020-08-30 NOTE — Progress Notes (Signed)
CSW met with MOB to complete consult for "sexual favors for transportation". CSW observed MOB resting in bed, while infant was in circumcision procedure. Via translator [phone], CSW explained role and reason for consult. Despite there being a huge language barrier, MOB was pleasant, polite, and engaged with CSW. MOB denied, mental health history, despite chart stating, MOB experience anxiety and depression. MOB reported, there are no barriers to follow up infant's care. MOB denied, any sexual favors for transportation. MOB reported, either FOB or friends will transport her and infant to appointments. MOB reported, she has all essentials needed to care for infant. MOB reported, infant has a car seat and crib. MOB denied any additional barriers.    When CSW asked MOB of her emotions since delivery. MOB reported, she feels, "okay". MOB reported, her friends and neighbors are her supports. MOB denied SI, HI, and DV when CSW assessed for safety.  CSW provided education regarding the baby blues period vs. perinatal mood disorders, discussed treatment and gave resources for mental health follow up if concerns arise. CSW recommends self- evaluation during the postpartum time period using the New Mom Checklist from Postpartum Progress and encouraged MOB to contact a medical professional if symptoms are noted at any time.    CSW provided education on sudden infant death syndrome (SIDS).  MOB denied any sexual favors for transportation. Therefore, CSW identifies no further need for intervention or barriers to discharge at this time.   Darcus Austin, MSW, LCSW-A Clinical Social Worker- Weekends [phone]

## 2020-08-30 NOTE — Progress Notes (Addendum)
CSW acknowledges and has complete consult for MOB. MOB denied any sexual favors for transportation. CSW will update note when time permits.   Dolores Frame, MSW, LCSW-A Clinical Social Worker [phone]

## 2020-09-03 ENCOUNTER — Encounter: Payer: Medicaid Other | Admitting: Family Medicine

## 2020-09-10 ENCOUNTER — Telehealth (HOSPITAL_COMMUNITY): Payer: Self-pay

## 2020-09-10 NOTE — Telephone Encounter (Signed)
No answer. Left message to return nurse call.    Marcelino Duster [facility] 09/09/2020,1529

## 2020-09-11 ENCOUNTER — Encounter: Payer: Medicaid Other | Admitting: Obstetrics & Gynecology

## 2020-10-07 ENCOUNTER — Encounter: Payer: Self-pay | Admitting: Family Medicine

## 2020-10-07 ENCOUNTER — Other Ambulatory Visit: Payer: Self-pay

## 2020-10-07 ENCOUNTER — Ambulatory Visit (INDEPENDENT_AMBULATORY_CARE_PROVIDER_SITE_OTHER): Payer: Medicaid Other | Admitting: Family Medicine

## 2020-10-07 VITALS — BP 117/72 | HR 83 | Wt 193.0 lb

## 2020-10-07 DIAGNOSIS — R87610 Atypical squamous cells of undetermined significance on cytologic smear of cervix (ASC-US): Secondary | ICD-10-CM | POA: Diagnosis not present

## 2020-10-07 DIAGNOSIS — Z758 Other problems related to medical facilities and other health care: Secondary | ICD-10-CM

## 2020-10-07 DIAGNOSIS — O09529 Supervision of elderly multigravida, unspecified trimester: Secondary | ICD-10-CM

## 2020-10-07 DIAGNOSIS — Z789 Other specified health status: Secondary | ICD-10-CM

## 2020-10-07 NOTE — Progress Notes (Signed)
Post Partum Visit Note  Jessica Bender is a 35 y.o. [phone] female who presents for a postpartum visit. She is 5 weeks postpartum following a normal spontaneous vaginal delivery.  I have fully reviewed the prenatal and intrapartum course. The delivery was at 39 gestational weeks.  Anesthesia: none. Postpartum course has been normal. Baby is doing well. Baby is feeding by both breast and bottle - Lucien Mons Start Gentle Soy . Bleeding no bleeding. Bowel function is normal. Bladder function is normal. Patient is not sexually active. Contraception method is Nexplanon. Postpartum depression screening: negative.   The pregnancy intention screening data noted above was reviewed. Potential methods of contraception were discussed. The patient elected to proceed with No data recorded.   Edinburgh Postnatal Depression Scale - 10/07/20 1042       Edinburgh Postnatal Depression Scale:  In the Past 7 Days   I have been able to laugh and see the funny side of things. 0    I have looked forward with enjoyment to things. 0    I have blamed myself unnecessarily when things went wrong. 0    I have been anxious or worried for no good reason. 0    I have felt scared or panicky for no good reason. 0    Things have been getting on top of me. 2    I have been so unhappy that I have had difficulty sleeping. 1    I have felt sad or miserable. 2    I have been so unhappy that I have been crying. 0    The thought of harming myself has occurred to me. 0    Edinburgh Postnatal Depression Scale Total 5             Health Maintenance Due  Topic Date Due   COVID-19 Vaccine (1) Never done   INFLUENZA VACCINE  09/14/2020    The following portions of the patient's history were reviewed and updated as appropriate: allergies, current medications, past family history, past medical history, past social history, past surgical history, and problem list.  Review of Systems Pertinent items are noted in  HPI.  Objective:  BP 117/72   Pulse 83   Wt 193 lb (87.5 kg)   Breastfeeding Yes   BMI 31.15 kg/m    General:  alert, cooperative, and no distress  Lungs: clear to auscultation bilaterally  Heart:  regular rate and rhythm, S1, S2 normal, no murmur, click, rub or gallop  Abdomen: soft, non-tender; bowel sounds normal; no masses,  no organomegaly   GU exam:  not indicated       Assessment:     1. ASCUS of cervix with negative high risk HPV   2. Postpartum exam   3. AMA (advanced maternal age) multigravida 35+, unspecified trimester   4. Language barrier      Plan:   Essential components of care per ACOG recommendations:  1.  Mood and well being: Patient with negative depression screening today. Reviewed local resources for support.  - Patient tobacco use? No.   - hx of drug use? No.    2. Infant care and feeding:  -Patient currently breastmilk feeding? Yes. Reviewed importance of draining breast regularly to support lactation.  -Social determinants of health (SDOH) reviewed in EPIC. Pregnancy care management involved with patient  3. Sexuality, contraception and birth spacing - Patient does not want a pregnancy in the next year.   - Reviewed forms of contraception in tiered fashion.  Patient desired  nexplanon  today.   - Discussed birth spacing of 18 months  4. Sleep and fatigue -Encouraged family/partner/community support of 4 hrs of uninterrupted sleep to help with mood and fatigue  5. Physical Recovery  - Discussed patients delivery and complications. She describes her labor as good. - Patient had a Vaginal, no problems at delivery. Patient had  no  laceration. Perineal healing reviewed. Patient expressed understanding - Patient has urinary incontinence? No. - Patient is safe to resume physical and sexual activity  6.  Health Maintenance - HM due items addressed Yes - Last pap smear  Diagnosis  Date Value Ref Range Status  04/28/2020 (A)  Final   - Atypical  squamous cells of undetermined significance (ASC-US)   Pap smear not done at today's visit.  -Breast Cancer screening indicated? No.   7. Chronic Disease/Pregnancy Condition follow up: None Follow up in 04/2021 for rpt PAP - PCP follow up  Levie Heritage, DO Center for [facility], [facility]

## 2021-03-23 ENCOUNTER — Encounter: Payer: Self-pay | Admitting: General Practice

## 2021-07-03 IMAGING — US US MFM OB DETAIL+14 WK
1 series · 13 of 28 positions shown · non-contrast
Comparison: none

[Series 1: us mfm ob detail+14 wk · 13 of 137 slices shown]
[im 6/137]
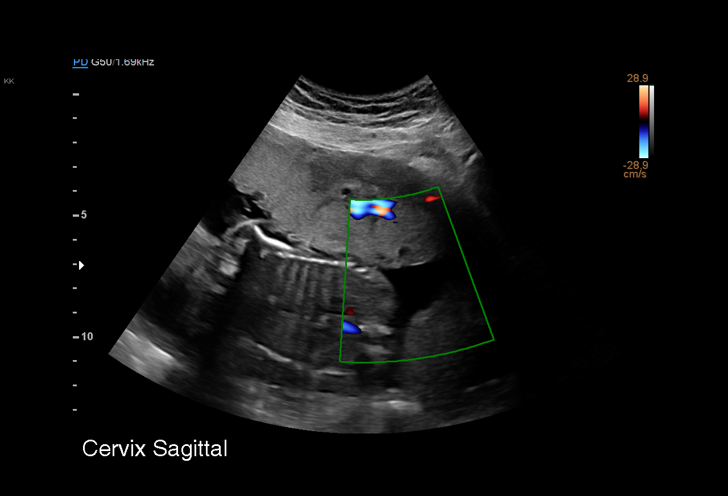
[im 16/137]
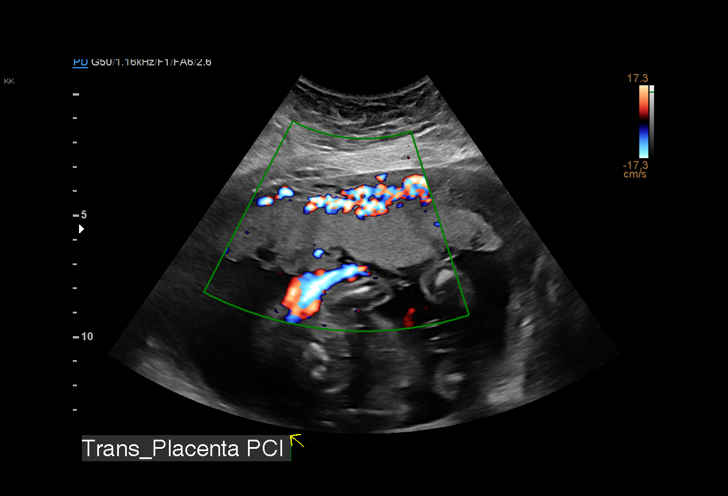
[im 26/137]
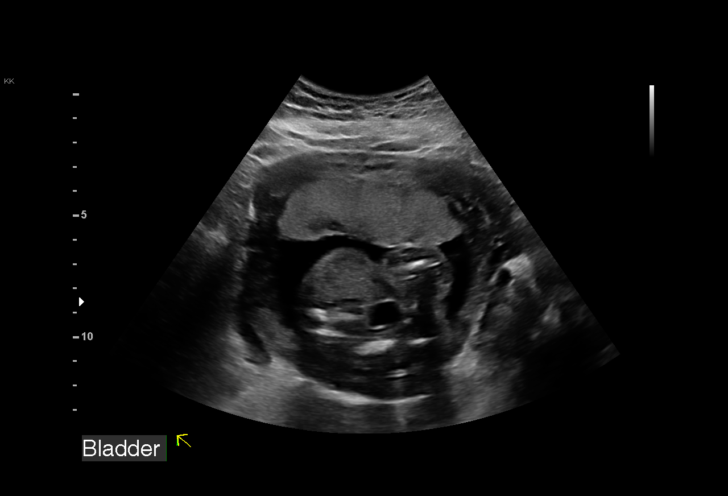
[im 36/137]
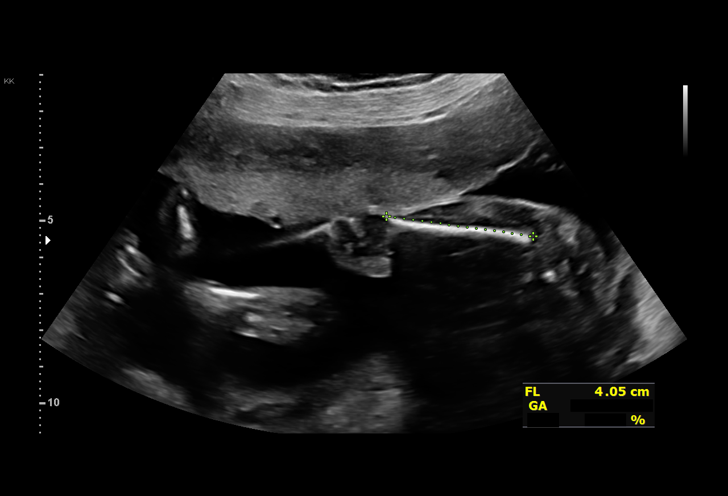
[im 46/137]
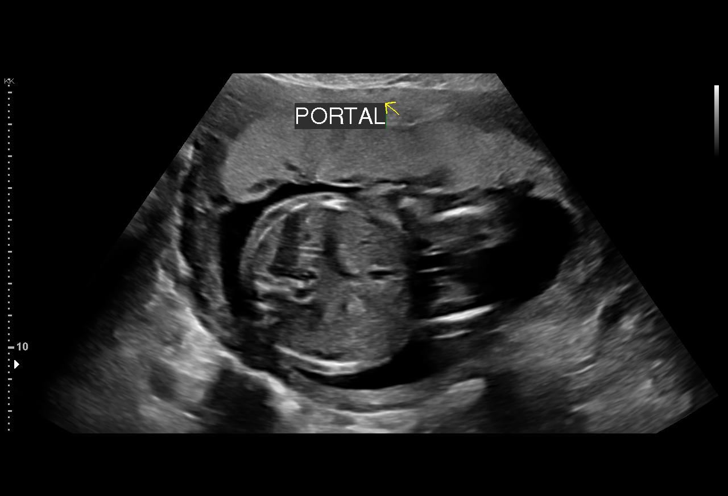
[im 56/137]
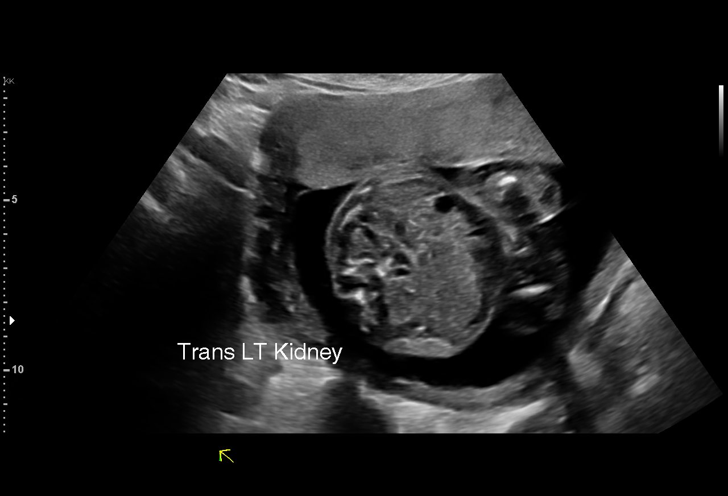
[im 71/137]
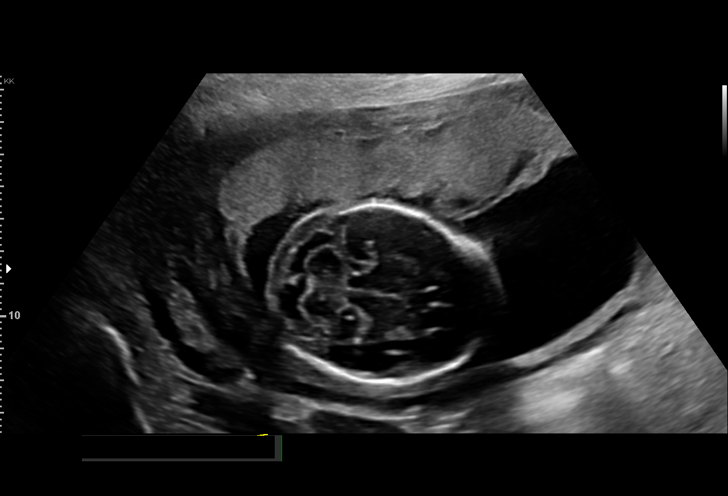
[im 81/137]
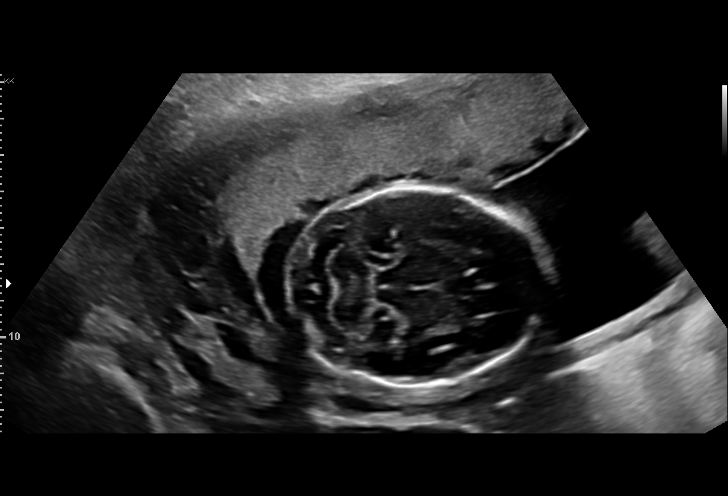
[im 91/137]
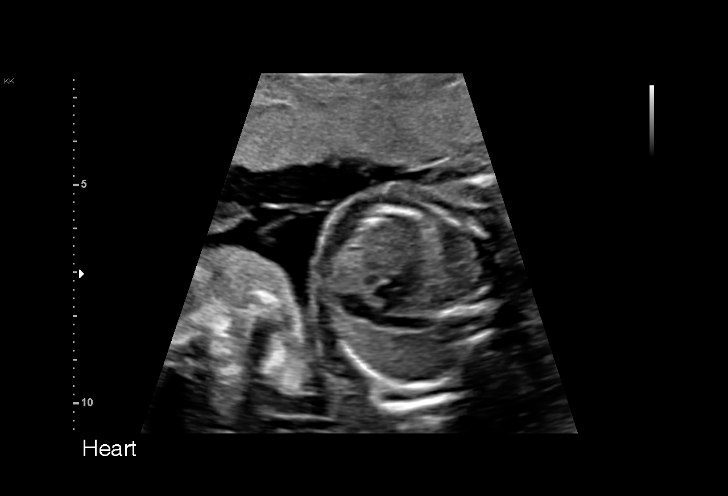
[im 101/137]
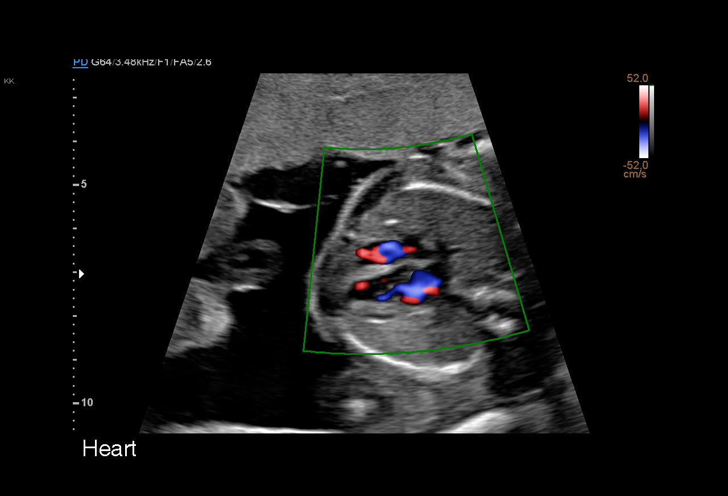
[im 111/137]
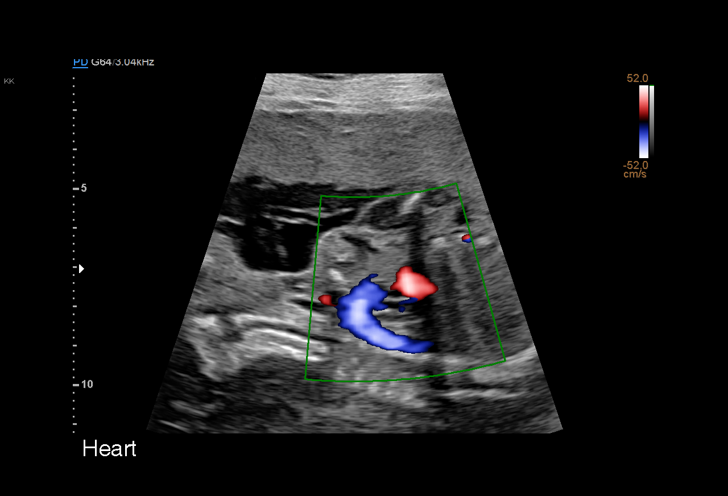
[im 121/137]
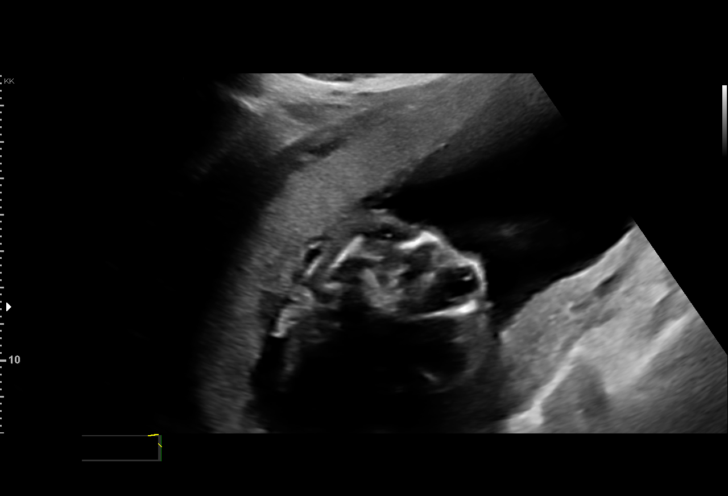
[im 131/137]
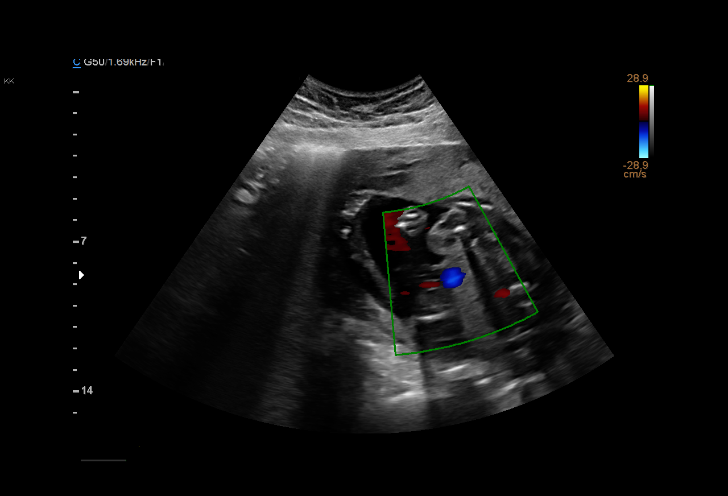

[13 of 28 positions shown; findings below may reference images not displayed]

[email or web address]

 Ref. Address:     [REDACTED]
                   [id number]

Indications

 Encounter for antenatal screening for
 malformations
 Advanced maternal age multigravida 35+,
 second trimester (35 yrs)
 Late prenatal care, second trimester
 Obesity complicating pregnancy, second
 trimester (Pregravid BMI 30.55)
 Encounter for uncertain dates
 22 weeks gestation of pregnancy
 Low Risk NIPS
Vital Signs

 BMI:
Fetal Evaluation

 Num Of Fetuses:         1
 Fetal Heart Rate(bpm):  147
 Cardiac Activity:       Observed
 Presentation:           Breech
 Placenta:               Anterior
 P. Cord Insertion:      Visualized
 Amniotic Fluid
 AFI FV:      Within normal limits

                             Largest Pocket(cm)

Biometry

 BPD:      54.8  mm     G. Age:  22w 5d         45  %    CI:        73.37   %    70 - 86
                                                         FL/HC:      19.4   %    19.2 -
 HC:      203.3  mm     G. Age:  22w 3d         26  %    HC/AC:      1.08        1.05 -
 AC:      188.4  mm     G. Age:  23w 4d         70  %    FL/BPD:     72.1   %    71 - 87
 FL:       39.5  mm     G. Age:  22w 5d         39  %    FL/AC:      21.0   %    20 - 24
 HUM:      38.6  mm     G. Age:  23w 5d         66  %
 CER:      25.6  mm     G. Age:  23w 2d         88  %

 LV:        6.9  mm
 CM:          6  mm

 Est. FW:     564  gm      1 lb 4 oz     63  %
OB History

 Gravidity:    6         Term:   4        Prem:   0        SAB:   1
 TOP:          0       Ectopic:  0        Living: 4
Gestational Age

 LMP:           22w 5d        Date:  11/29/19                 EDD:   09/04/20
 U/S Today:     22w 6d                                        EDD:   09/03/20
 Best:          22w 5d     Det. By:  LMP  (11/29/19)          EDD:   09/04/20
Anatomy

 Cranium:               Appears normal         Aortic Arch:            Appears normal
 Cavum:                 Appears normal         Ductal Arch:            Appears normal
 Ventricles:            Appears normal         Diaphragm:              Appears normal
 Choroid Plexus:        Appears normal         Stomach:                Appears normal, left
                                                                       sided
 Cerebellum:            Appears normal         Abdomen:                Appears normal
 Posterior Fossa:       Appears normal         Abdominal Wall:         Appears nml (cord
                                                                       insert, abd wall)
 Nuchal Fold:           Not well visualized    Cord Vessels:           Appears normal (3
                                                                       vessel cord)
 Face:                  Appears normal         Kidneys:                Appear normal
                        (orbits and profile)
 Lips:                  Appears normal         Bladder:                Appears normal
 Thoracic:              Appears normal         Spine:                  Not well visualized
 Heart:                 Appears normal         Upper Extremities:      Appears normal
                        (4CH, axis, and
                        situs)
 RVOT:                  Appears normal         Lower Extremities:      Appears normal
 LVOT:                  Appears normal

 Other:  Fetus appears to be a male. Heels visualized. Hands not well
         visualized. Technically difficult due to fetal position.
Cervix Uterus Adnexa
 Cervix
 Length:            3.3  cm.
 Normal appearance by transabdominal scan.

 Adnexa
 No abnormality visualized.
Impression

 Single intrauterine pregnancy here for a detailed anatomy
 due advanced maternal age and is late to prenatal care.
 Normal anatomy with measurements consistent with dates
 There is good fetal movement and amniotic fluid volume
 Suboptimal views of the fetal anatomy were obtained
 secondary to fetal position.
Recommendations

 Follow up growth in 4 weeks.

## 2022-12-06 LAB — OB RESULTS CONSOLE HIV ANTIBODY (ROUTINE TESTING): HIV: NONREACTIVE

## 2022-12-06 LAB — OB RESULTS CONSOLE RUBELLA ANTIBODY, IGM: Rubella: IMMUNE

## 2022-12-06 LAB — OB RESULTS CONSOLE RPR: RPR: NONREACTIVE

## 2022-12-06 LAB — OB RESULTS CONSOLE HEPATITIS B SURFACE ANTIGEN: Hepatitis B Surface Ag: NEGATIVE

## 2022-12-06 LAB — HEPATITIS C ANTIBODY: HCV Ab: NEGATIVE

## 2023-02-15 NOTE — L&D Delivery Note (Signed)
 OB/GYN Faculty Practice Delivery Note  Jessica Bender is a 38 y.o. [id number] s/p NSVD at [redacted]w[redacted]d. She was admitted for SOL.   ROM: 1h 59m with clear fluid GBS Status: neg Maximum Maternal Temperature: 98.6  Labor Progress: Admitted at 4.5, AROM  Delivery Date/Time: 05/28/2023 at 1100am Delivery: Called to room and patient was complete and pushing. Head delivered LOA. No nuchal cord present. Shoulder and body delivered in usual fashion. Infant with spontaneous cry, placed on mother's abdomen, dried and stimulated. Cord clamped x 2 after 1-minute delay, and cut by nursing student. Cord blood drawn. Placenta delivered spontaneously with gentle cord traction. Prophylactic TXA administered. Fundus firm with massage and Pitocin. Labia, perineum, vagina, and cervix inspected. Vaginal abrasion and 1st degree laceration noted to be hemostatic. No repair needed.  Placenta: intact to L&D Complications: none Lacerations: 1st degree perineal, vaginal abrasion EBL: 69 Analgesia: none   Infant: girl "Desiree Florence 9/9  [address] Student-MidWife  05/28/2023 11:21 AM

## 2023-04-20 ENCOUNTER — Encounter (HOSPITAL_COMMUNITY): Payer: Self-pay | Admitting: *Deleted

## 2023-04-20 ENCOUNTER — Other Ambulatory Visit: Payer: Self-pay

## 2023-04-20 ENCOUNTER — Inpatient Hospital Stay (HOSPITAL_COMMUNITY)
Admission: AD | Admit: 2023-04-20 | Discharge: 2023-04-20 | Disposition: A | Discharge status: Home / Self Care | Attending: Obstetrics & Gynecology | Admitting: Obstetrics & Gynecology

## 2023-04-20 DIAGNOSIS — Z3A33 33 weeks gestation of pregnancy: Secondary | ICD-10-CM | POA: Diagnosis not present

## 2023-04-20 DIAGNOSIS — R1030 Lower abdominal pain, unspecified: Secondary | ICD-10-CM | POA: Insufficient documentation

## 2023-04-20 DIAGNOSIS — R109 Unspecified abdominal pain: Secondary | ICD-10-CM | POA: Diagnosis present

## 2023-04-20 DIAGNOSIS — M7918 Myalgia, other site: Secondary | ICD-10-CM

## 2023-04-20 DIAGNOSIS — O26893 Other specified pregnancy related conditions, third trimester: Secondary | ICD-10-CM | POA: Diagnosis not present

## 2023-04-20 LAB — URINALYSIS, ROUTINE W REFLEX MICROSCOPIC
Bilirubin Urine: NEGATIVE
Glucose, UA: NEGATIVE mg/dL
Ketones, ur: NEGATIVE mg/dL
Nitrite: NEGATIVE
Protein, ur: NEGATIVE mg/dL
Specific Gravity, Urine: 1.006 (ref 1.005–1.030)
pH: 6 (ref 5.0–8.0)

## 2023-04-20 LAB — CBC WITH DIFFERENTIAL/PLATELET
Abs Immature Granulocytes: 0.14 10*3/uL — ABNORMAL HIGH (ref 0.00–0.07)
Basophils Absolute: 0 10*3/uL (ref 0.0–0.1)
Basophils Relative: 0 %
Eosinophils Absolute: 0.1 10*3/uL (ref 0.0–0.5)
Eosinophils Relative: 1 %
HCT: 39.8 % (ref 36.0–46.0)
Hemoglobin: 13.4 g/dL (ref 12.0–15.0)
Immature Granulocytes: 1 %
Lymphocytes Relative: 16 %
Lymphs Abs: 1.7 10*3/uL (ref 0.7–4.0)
MCH: 29.7 pg (ref 26.0–34.0)
MCHC: 33.7 g/dL (ref 30.0–36.0)
MCV: 88.2 fL (ref 80.0–100.0)
Monocytes Absolute: 1 10*3/uL (ref 0.1–1.0)
Monocytes Relative: 10 %
Neutro Abs: 7.4 10*3/uL (ref 1.7–7.7)
Neutrophils Relative %: 72 %
Platelets: 219 10*3/uL (ref 150–400)
RBC: 4.51 MIL/uL (ref 3.87–5.11)
RDW: 13.1 % (ref 11.5–15.5)
WBC: 10.3 10*3/uL (ref 4.0–10.5)
nRBC: 0 % (ref 0.0–0.2)

## 2023-04-20 LAB — WET PREP, GENITAL
Clue Cells Wet Prep HPF POC: NONE SEEN
Sperm: NONE SEEN
Trich, Wet Prep: NONE SEEN
WBC, Wet Prep HPF POC: >=10 — AB (ref <10)
Yeast Wet Prep HPF POC: NONE SEEN

## 2023-04-20 LAB — FETAL FIBRONECTIN: Fetal Fibronectin: NEGATIVE

## 2023-04-20 MED ORDER — LACTATED RINGERS IV BOLUS
1000.0000 mL | Freq: Once | INTRAVENOUS | Status: AC
  Administered 2023-04-20: 1000 mL via INTRAVENOUS

## 2023-04-20 MED ORDER — NIFEDIPINE 10 MG PO CAPS
10.0000 mg | ORAL_CAPSULE | ORAL | Status: DC | PRN
  Filled 2023-04-20: qty 1

## 2023-04-20 MED ORDER — CYCLOBENZAPRINE HCL 5 MG PO TABS: 5.0000 mg | ORAL_TABLET | Freq: Three times a day (TID) | ORAL | 0 refills | Status: DC | PRN

## 2023-04-20 MED ORDER — BETAMETHASONE SOD PHOS & ACET 6 (3-3) MG/ML IJ SUSP
12.0000 mg | INTRAMUSCULAR | Status: DC
  Administered 2023-04-20: 12 mg via INTRAMUSCULAR
  Filled 2023-04-20: qty 5

## 2023-04-20 MED ORDER — CYCLOBENZAPRINE HCL 5 MG PO TABS
10.0000 mg | ORAL_TABLET | Freq: Once | ORAL | Status: AC
  Administered 2023-04-20: 10 mg via ORAL
  Filled 2023-04-20: qty 2

## 2023-04-20 NOTE — MAU Note (Signed)
 Jessica Bender is a 38 y.o. at Unknown here in MAU reporting: she's been having dizziness today since 1300.  Also c/o lower abdomen "feeling heavy".  Denies VB or LOF.  Endorses +FM.  LMP: NA Onset of complaint: today Pain score: 0 Vitals:   04/20/23 1613  BP: (!) 98/56  Pulse: 87  Resp: 19  Temp: 98 F (36.7 C)  SpO2: 100%     FHT: 153 bpm  Lab orders placed from triage: None

## 2023-04-20 NOTE — MAU Provider Note (Signed)
 None     S [name] is a 38 y.o. [phone] pregnant female at Unknown who presents to MAU today with complaint of dizziness and "heaviness in lower abdomen" both symptoms started at approximately 1330.  Reports she is feeling baby move, although not as much as usual. Reports she is now feeling baby move normally. Denies VB, or LOF. Reports the lower abdominal pain is coming and going. Denies intercourse in the past 72 hours. Denies any sick contacts. Last meal was this morning around 1030. Denies any nausea or vomiting.   Receives care at [facility] in [facility]. Prenatal records reviewed.  Pertinent items noted in HPI and remainder of comprehensive ROS otherwise negative.   O BP (!) 109/56   Pulse 90   Temp 98 F (36.7 C) (Oral)   Resp 19   Wt 85.2 kg   SpO2 100%   BMI 30.33 kg/m  Physical Exam Vitals reviewed.  Constitutional:      Appearance: Normal appearance.  HENT:     Head: Normocephalic.  Cardiovascular:     Rate and Rhythm: Normal rate and regular rhythm.     Pulses: Normal pulses.     Heart sounds: Normal heart sounds.  Pulmonary:     Effort: Pulmonary effort is normal.     Breath sounds: Normal breath sounds.  Musculoskeletal:        General: Normal range of motion.  Skin:    General: Skin is warm and dry.     Capillary Refill: Capillary refill takes less than 2 seconds.  Neurological:     Mental Status: She is alert and oriented to person, place, and time.  Psychiatric:        Mood and Affect: Mood normal.        Behavior: Behavior normal.        Thought Content: Thought content normal.        Judgment: Judgment normal.   Dilation: 2 Effacement (%): 70 Station: -3 Exam by:: Lamont Snowball, CNM    MDM: Rule out preterm labor: SCE, FFN - negative FFN CBC to rule out  Wet prep and GC to rule out infection After ruling out other cause, likely musculoskeletal pain. Trial of flexeril with good relief.   MAU Course:  A Musculoskeletal  pain  [redacted] weeks gestation of pregnancy  Medical screening exam complete Given FFN negative, and minimal discomfort, did not perform second SCE.  Swabs negative.   P Good relief of pain following flexeril for musculoskeletal pain, sent limited prescription to preferred pharmacy.  Advised maternity support belt to help with discomfort.  Letter provided for work.  Discharge from MAU in stable condition with routine precautions Follow up at [facility] as scheduled for ongoing prenatal care  Allergies as of 04/20/2023   No Known Allergies      Medication List     TAKE these medications    cyclobenzaprine 5 MG tablet Commonly known as: FLEXERIL Take 1 tablet (5 mg total) by mouth 3 (three) times daily as needed.       - Remote Kinyarwandan interpreter used for entire episode of care.   Lamont Snowball, MSN, CNM 04/20/2023 9:22 PM  Certified Nurse Midwife, [facility]

## 2023-04-21 LAB — GC/CHLAMYDIA PROBE AMP (~~LOC~~) NOT AT ARMC
Chlamydia: NEGATIVE
Comment: NEGATIVE
Comment: NORMAL
Neisseria Gonorrhea: NEGATIVE

## 2023-05-05 ENCOUNTER — Encounter (HOSPITAL_COMMUNITY): Payer: Self-pay | Admitting: Obstetrics & Gynecology

## 2023-05-05 ENCOUNTER — Inpatient Hospital Stay (HOSPITAL_COMMUNITY)
Admission: AD | Admit: 2023-05-05 | Discharge: 2023-05-05 | Disposition: A | Discharge status: Home / Self Care | Attending: Obstetrics & Gynecology | Admitting: Obstetrics & Gynecology

## 2023-05-05 DIAGNOSIS — M549 Dorsalgia, unspecified: Secondary | ICD-10-CM | POA: Diagnosis not present

## 2023-05-05 DIAGNOSIS — O09523 Supervision of elderly multigravida, third trimester: Secondary | ICD-10-CM | POA: Insufficient documentation

## 2023-05-05 DIAGNOSIS — Z3689 Encounter for other specified antenatal screening: Secondary | ICD-10-CM

## 2023-05-05 DIAGNOSIS — O99891 Dorsalgia, unspecified: Secondary | ICD-10-CM

## 2023-05-05 DIAGNOSIS — R1030 Lower abdominal pain, unspecified: Secondary | ICD-10-CM | POA: Diagnosis not present

## 2023-05-05 DIAGNOSIS — R350 Frequency of micturition: Secondary | ICD-10-CM | POA: Insufficient documentation

## 2023-05-05 DIAGNOSIS — Z3A35 35 weeks gestation of pregnancy: Secondary | ICD-10-CM | POA: Diagnosis not present

## 2023-05-05 DIAGNOSIS — O26893 Other specified pregnancy related conditions, third trimester: Secondary | ICD-10-CM | POA: Insufficient documentation

## 2023-05-05 DIAGNOSIS — O26899 Other specified pregnancy related conditions, unspecified trimester: Secondary | ICD-10-CM

## 2023-05-05 LAB — URINALYSIS, ROUTINE W REFLEX MICROSCOPIC
Bilirubin Urine: NEGATIVE
Glucose, UA: NEGATIVE mg/dL
Hgb urine dipstick: NEGATIVE
Ketones, ur: NEGATIVE mg/dL
Nitrite: NEGATIVE
Protein, ur: NEGATIVE mg/dL
Specific Gravity, Urine: 1.003 — ABNORMAL LOW (ref 1.005–1.030)
pH: 6 (ref 5.0–8.0)

## 2023-05-05 NOTE — MAU Note (Signed)
.  Jessica Bender is a 37 y.o. at [redacted]w[redacted]d here in MAU reporting: Back pain and lower abdominal pain that comes and goes since midnight 7/10 ctxs. Has not taken any pain medicine.  No lOF, no bleeding +FM.   Vitals:   05/05/23 0240  BP: 108/61  Pulse: 92  Resp: 19  Temp: 97.8 F (36.6 C)  SpO2: 100%     FHT: 144  Lab orders placed from triage: UA

## 2023-05-05 NOTE — MAU Provider Note (Signed)
 History     CSN: [id number]  Arrival date and time: 05/05/23 0218   None     No chief complaint on file.  Jessica Bender is a 38 y.o. [id number] at [redacted]w[redacted]d who receives care at [facility]  She presents today for back and lower abdominal pain. She reports the pain started at midnight and has been intermittent.  She states the pain feels like contractions.  Patient endorses fetal movement. She denies vaginal bleeding or discharge. She denies LOF. She does report some increased urinary frequency.   OB History     Gravida  7   Para  5   Term  5   Preterm      AB  1   Living  5      SAB  1   IAB      Ectopic      Multiple  0   Live Births  5           Past Medical History:  Diagnosis Date   Hearing loss    right ear-    Medical history non-contributory    TB lung, latent    negative chest x-ray 2020    Past Surgical History:  Procedure Laterality Date   NO PAST SURGERIES      History reviewed. No pertinent family history.  Social History   Tobacco Use   Smoking status: Never   Smokeless tobacco: Never  Vaping Use   Vaping status: Never Used  Substance Use Topics   Alcohol use: Never   Drug use: Never    Allergies: No Known Allergies  Medications Prior to Admission  Medication Sig Dispense Refill Last Dose/Taking   Prenatal Vit-Fe Fumarate-FA (MULTIVITAMIN-PRENATAL) 27-0.8 MG TABS tablet Take 1 tablet by mouth daily at 12 noon.   Past Week   cyclobenzaprine (FLEXERIL) 5 MG tablet Take 1 tablet (5 mg total) by mouth 3 (three) times daily as needed. 20 tablet 0     Review of Systems  Gastrointestinal:  Positive for abdominal pain (Lower abdominal cramping). Negative for constipation and diarrhea.  Genitourinary:  Positive for dysuria (Burning). Negative for difficulty urinating, vaginal bleeding and vaginal discharge.   Physical Exam   Blood pressure 108/61, pulse 92, temperature 97.8 F (36.6 C), temperature source Oral, resp. rate  19, weight 87.5 kg, SpO2 100%, currently breastfeeding.  Physical Exam Vitals and nursing note reviewed. Exam conducted with a chaperone present Bridget Hartshorn, RN).  Constitutional:      Appearance: Normal appearance.  HENT:     Head: Normocephalic and atraumatic.  Eyes:     Conjunctiva/sclera: Conjunctivae normal.  Cardiovascular:     Rate and Rhythm: Normal rate.  Pulmonary:     Effort: Pulmonary effort is normal. No respiratory distress.  Abdominal:     Palpations: Abdomen is soft.     Comments: Gravid, Appears SGA  Genitourinary:    Comments: Dilation: 1 Effacement (%): Thick Station: -3 Presentation: Vertex Exam by:: Gerrit Heck, CNM  Musculoskeletal:        General: Normal range of motion.     Cervical back: Normal range of motion.  Skin:    General: Skin is warm and dry.  Neurological:     Mental Status: She is alert and oriented to person, place, and time.  Psychiatric:        Mood and Affect: Mood normal.        Behavior: Behavior normal.     Fetal Assessment 135 bpm, Mod Var, -Decels, +  Accels Toco: Sporadic mild ctx graphed  MAU Course   Results for orders placed or performed during the hospital encounter of 05/05/23 (from the past 24 hours)  Urinalysis, Routine w reflex microscopic -Urine, Clean Catch     Status: Abnormal   Collection Time: 05/05/23  2:20 AM  Result Value Ref Range   Color, Urine STRAW (A) YELLOW   APPearance CLEAR CLEAR   Specific Gravity, Urine 1.003 (L) 1.005 - 1.030   pH 6.0 5.0 - 8.0   Glucose, UA NEGATIVE NEGATIVE mg/dL   Hgb urine dipstick NEGATIVE NEGATIVE   Bilirubin Urine NEGATIVE NEGATIVE   Ketones, ur NEGATIVE NEGATIVE mg/dL   Protein, ur NEGATIVE NEGATIVE mg/dL   Nitrite NEGATIVE NEGATIVE   Leukocytes,Ua MODERATE (A) NEGATIVE   RBC / HPF 0-5 0 - 5 RBC/hpf   WBC, UA 0-5 0 - 5 WBC/hpf   Bacteria, UA RARE (A) NONE SEEN   Squamous Epithelial / HPF 0-5 0 - 5 /HPF   No results found.  MDM PE Labs: UA EFM  Assessment  and Plan  38 year old [id number]  SIUP at 31.4weeks Cat I FT Abdominal Pain Back Pain   -POC Reviewed.  -Exam performed and findings discussed. -Informed that cervix is 1cm and not indicative of labor. -Patient offered and declines pain medication. -Discussed discharge. -Patient with return/labor questions. Addressed. -Instructed to keep next appt as scheduled.  -Encouraged to call primary office or return to MAU if symptoms worsen or with the onset of new symptoms. -Discharged to home in stable condition. -Interpretations completed with assistance of phone interpreter x 2.  Cherre Robins MSN, CNM 05/05/2023, 3:16 AM

## 2023-05-09 LAB — OB RESULTS CONSOLE GBS: GBS: NEGATIVE

## 2023-05-28 ENCOUNTER — Other Ambulatory Visit: Payer: Self-pay

## 2023-05-28 ENCOUNTER — Inpatient Hospital Stay (HOSPITAL_COMMUNITY)
Admission: AD | Admit: 2023-05-28 | Discharge: 2023-05-29 | DRG: 807 | Disposition: A | Discharge status: Home / Self Care | Attending: Family Medicine | Admitting: Family Medicine

## 2023-05-28 ENCOUNTER — Encounter (HOSPITAL_COMMUNITY): Payer: Self-pay | Admitting: Family Medicine

## 2023-05-28 DIAGNOSIS — Z758 Other problems related to medical facilities and other health care: Secondary | ICD-10-CM | POA: Diagnosis present

## 2023-05-28 DIAGNOSIS — Z227 Latent tuberculosis: Secondary | ICD-10-CM | POA: Diagnosis present

## 2023-05-28 DIAGNOSIS — R87610 Atypical squamous cells of undetermined significance on cytologic smear of cervix (ASC-US): Secondary | ICD-10-CM | POA: Diagnosis present

## 2023-05-28 DIAGNOSIS — Z5982 Transportation insecurity: Secondary | ICD-10-CM | POA: Diagnosis not present

## 2023-05-28 DIAGNOSIS — H9191 Unspecified hearing loss, right ear: Secondary | ICD-10-CM | POA: Diagnosis not present

## 2023-05-28 DIAGNOSIS — Z3A38 38 weeks gestation of pregnancy: Secondary | ICD-10-CM

## 2023-05-28 DIAGNOSIS — O26893 Other specified pregnancy related conditions, third trimester: Secondary | ICD-10-CM | POA: Diagnosis present

## 2023-05-28 DIAGNOSIS — Z609 Problem related to social environment, unspecified: Secondary | ICD-10-CM

## 2023-05-28 DIAGNOSIS — O09523 Supervision of elderly multigravida, third trimester: Secondary | ICD-10-CM

## 2023-05-28 DIAGNOSIS — R7611 Nonspecific reaction to tuberculin skin test without active tuberculosis: Secondary | ICD-10-CM | POA: Diagnosis present

## 2023-05-28 LAB — COMPREHENSIVE METABOLIC PANEL WITH GFR
ALT: 9 U/L (ref 0–44)
AST: 18 U/L (ref 15–41)
Albumin: 2.9 g/dL — ABNORMAL LOW (ref 3.5–5.0)
Alkaline Phosphatase: 155 U/L — ABNORMAL HIGH (ref 38–126)
Anion gap: 11 (ref 5–15)
BUN: 9 mg/dL (ref 6–20)
CO2: 19 mmol/L — ABNORMAL LOW (ref 22–32)
Calcium: 9.7 mg/dL (ref 8.9–10.3)
Chloride: 105 mmol/L (ref 98–111)
Creatinine, Ser: 0.42 mg/dL — ABNORMAL LOW (ref 0.44–1.00)
GFR, Estimated: >60 mL/min (ref >60)
Glucose, Bld: 93 mg/dL (ref 70–99)
Potassium: 3.8 mmol/L (ref 3.5–5.1)
Sodium: 135 mmol/L (ref 135–145)
Total Bilirubin: 0.5 mg/dL (ref 0.0–1.2)
Total Protein: 6.7 g/dL (ref 6.5–8.1)

## 2023-05-28 LAB — CBC WITH DIFFERENTIAL/PLATELET
Abs Immature Granulocytes: 0.12 10*3/uL — ABNORMAL HIGH (ref 0.00–0.07)
Basophils Absolute: 0 10*3/uL (ref 0.0–0.1)
Basophils Relative: 0 %
Eosinophils Absolute: 0.1 10*3/uL (ref 0.0–0.5)
Eosinophils Relative: 1 %
HCT: 36.9 % (ref 36.0–46.0)
Hemoglobin: 12.4 g/dL (ref 12.0–15.0)
Immature Granulocytes: 1 %
Lymphocytes Relative: 19 %
Lymphs Abs: 1.9 10*3/uL (ref 0.7–4.0)
MCH: 28.4 pg (ref 26.0–34.0)
MCHC: 33.6 g/dL (ref 30.0–36.0)
MCV: 84.6 fL (ref 80.0–100.0)
Monocytes Absolute: 0.9 10*3/uL (ref 0.1–1.0)
Monocytes Relative: 9 %
Neutro Abs: 6.9 10*3/uL (ref 1.7–7.7)
Neutrophils Relative %: 70 %
Platelets: 206 10*3/uL (ref 150–400)
RBC: 4.36 MIL/uL (ref 3.87–5.11)
RDW: 14.4 % (ref 11.5–15.5)
WBC: 9.9 10*3/uL (ref 4.0–10.5)
nRBC: 0 % (ref 0.0–0.2)

## 2023-05-28 LAB — TYPE AND SCREEN
ABO/RH(D): O POS
Antibody Screen: NEGATIVE

## 2023-05-28 LAB — URINALYSIS, ROUTINE W REFLEX MICROSCOPIC
Bilirubin Urine: NEGATIVE
Glucose, UA: NEGATIVE mg/dL
Ketones, ur: NEGATIVE mg/dL
Nitrite: NEGATIVE
Protein, ur: NEGATIVE mg/dL
Specific Gravity, Urine: 1.005 (ref 1.005–1.030)
pH: 6 (ref 5.0–8.0)

## 2023-05-28 LAB — RPR: RPR Ser Ql: NONREACTIVE

## 2023-05-28 MED ORDER — MEDROXYPROGESTERONE ACETATE 150 MG/ML IM SUSP: 150.0000 mg | INTRAMUSCULAR | Status: DC | PRN

## 2023-05-28 MED ORDER — PRENATAL MULTIVITAMIN CH: 1.0000 | ORAL_TABLET | Freq: Every day | ORAL | Status: DC

## 2023-05-28 MED ORDER — ACETAMINOPHEN 325 MG PO TABS: 650.0000 mg | ORAL_TABLET | ORAL | Status: DC | PRN

## 2023-05-28 MED ORDER — OXYCODONE HCL 5 MG PO TABS: 5.0000 mg | ORAL_TABLET | ORAL | Status: DC | PRN

## 2023-05-28 MED ORDER — WITCH HAZEL-GLYCERIN EX PADS: 1.0000 | MEDICATED_PAD | CUTANEOUS | Status: DC | PRN

## 2023-05-28 MED ORDER — ONDANSETRON HCL 4 MG/2ML IJ SOLN: 4.0000 mg | INTRAMUSCULAR | Status: DC | PRN

## 2023-05-28 MED ORDER — OXYCODONE-ACETAMINOPHEN 5-325 MG PO TABS: 2.0000 | ORAL_TABLET | ORAL | Status: DC | PRN

## 2023-05-28 MED ORDER — TETANUS-DIPHTH-ACELL PERTUSSIS 5-2.5-18.5 LF-MCG/0.5 IM SUSY: 0.5000 mL | PREFILLED_SYRINGE | Freq: Once | INTRAMUSCULAR | Status: DC

## 2023-05-28 MED ORDER — TRANEXAMIC ACID-NACL 1000-0.7 MG/100ML-% IV SOLN: 1000.0000 mg | INTRAVENOUS | Status: DC

## 2023-05-28 MED ORDER — FERROUS SULFATE 325 (65 FE) MG PO TABS
325.0000 mg | ORAL_TABLET | ORAL | Status: DC
  Administered 2023-05-28: 325 mg via ORAL
  Filled 2023-05-28: qty 1

## 2023-05-28 MED ORDER — METHYLERGONOVINE MALEATE 0.2 MG PO TABS: 0.2000 mg | ORAL_TABLET | ORAL | Status: DC | PRN

## 2023-05-28 MED ORDER — OXYTOCIN-SODIUM CHLORIDE 30-0.9 UT/500ML-% IV SOLN
2.5000 [IU]/h | INTRAVENOUS | Status: DC
  Filled 2023-05-28: qty 500

## 2023-05-28 MED ORDER — COCONUT OIL OIL: 1.0000 | TOPICAL_OIL | Status: DC | PRN

## 2023-05-28 MED ORDER — SENNOSIDES-DOCUSATE SODIUM 8.6-50 MG PO TABS
2.0000 | ORAL_TABLET | ORAL | Status: DC
  Administered 2023-05-28: 2 via ORAL
  Filled 2023-05-28: qty 2

## 2023-05-28 MED ORDER — BISACODYL 10 MG RE SUPP: 10.0000 mg | Freq: Every day | RECTAL | Status: DC | PRN

## 2023-05-28 MED ORDER — OXYTOCIN-SODIUM CHLORIDE 30-0.9 UT/500ML-% IV SOLN: 1.0000 m[IU]/min | INTRAVENOUS | Status: DC

## 2023-05-28 MED ORDER — METHYLERGONOVINE MALEATE 0.2 MG/ML IJ SOLN: 0.2000 mg | INTRAMUSCULAR | Status: DC | PRN

## 2023-05-28 MED ORDER — BENZOCAINE-MENTHOL 20-0.5 % EX AERO
1.0000 | INHALATION_SPRAY | CUTANEOUS | Status: DC | PRN
  Filled 2023-05-28: qty 56

## 2023-05-28 MED ORDER — DIBUCAINE (PERIANAL) 1 % EX OINT: 1.0000 | TOPICAL_OINTMENT | CUTANEOUS | Status: DC | PRN

## 2023-05-28 MED ORDER — OXYTOCIN BOLUS FROM INFUSION
333.0000 mL | Freq: Once | INTRAVENOUS | Status: AC
  Administered 2023-05-28: 333 mL via INTRAVENOUS

## 2023-05-28 MED ORDER — LACTATED RINGERS IV SOLN: 500.0000 mL | INTRAVENOUS | Status: DC | PRN

## 2023-05-28 MED ORDER — SIMETHICONE 80 MG PO CHEW: 80.0000 mg | CHEWABLE_TABLET | ORAL | Status: DC | PRN

## 2023-05-28 MED ORDER — SOD CITRATE-CITRIC ACID 500-334 MG/5ML PO SOLN: 30.0000 mL | ORAL | Status: DC | PRN

## 2023-05-28 MED ORDER — TERBUTALINE SULFATE 1 MG/ML IJ SOLN: 0.2500 mg | Freq: Once | INTRAMUSCULAR | Status: DC | PRN

## 2023-05-28 MED ORDER — LACTATED RINGERS IV SOLN
INTRAVENOUS | Status: DC
Start: 2023-05-28 — End: 2023-05-28

## 2023-05-28 MED ORDER — IBUPROFEN 600 MG PO TABS
600.0000 mg | ORAL_TABLET | Freq: Four times a day (QID) | ORAL | Status: DC
  Administered 2023-05-28 – 2023-05-29 (×3): 600 mg via ORAL
  Filled 2023-05-28 (×3): qty 1

## 2023-05-28 MED ORDER — FLEET ENEMA RE ENEM: 1.0000 | ENEMA | Freq: Every day | RECTAL | Status: DC | PRN

## 2023-05-28 MED ORDER — OXYCODONE-ACETAMINOPHEN 5-325 MG PO TABS: 1.0000 | ORAL_TABLET | ORAL | Status: DC | PRN

## 2023-05-28 MED ORDER — TRANEXAMIC ACID-NACL 1000-0.7 MG/100ML-% IV SOLN
INTRAVENOUS | Status: AC
  Administered 2023-05-28: 1000 mg
  Filled 2023-05-28: qty 100

## 2023-05-28 MED ORDER — FLEET ENEMA RE ENEM: 1.0000 | ENEMA | RECTAL | Status: DC | PRN

## 2023-05-28 MED ORDER — ONDANSETRON HCL 4 MG PO TABS: 4.0000 mg | ORAL_TABLET | ORAL | Status: DC | PRN

## 2023-05-28 MED ORDER — OXYCODONE HCL 5 MG PO TABS: 10.0000 mg | ORAL_TABLET | ORAL | Status: DC | PRN

## 2023-05-28 MED ORDER — DIPHENHYDRAMINE HCL 25 MG PO CAPS: 25.0000 mg | ORAL_CAPSULE | Freq: Four times a day (QID) | ORAL | Status: DC | PRN

## 2023-05-28 MED ORDER — ONDANSETRON HCL 4 MG/2ML IJ SOLN: 4.0000 mg | Freq: Four times a day (QID) | INTRAMUSCULAR | Status: DC | PRN

## 2023-05-28 MED ORDER — LIDOCAINE HCL (PF) 1 % IJ SOLN
30.0000 mL | INTRAMUSCULAR | Status: DC | PRN
  Filled 2023-05-28: qty 30

## 2023-05-28 NOTE — Progress Notes (Signed)
 Jessica Bender is a 38 y.o. [id number] at [redacted]w[redacted]d admitted for SOL. [employer] interpreter line used for communication.  Subjective: Patient requesting to have medication to augment her labor.   Objective: BP (!) 106/59   Pulse 78   Temp 98.6 F (37 C) (Oral)   Resp 18   Ht 5\' 6"  (1.676 m)   Wt 86 kg   SpO2 100%   BMI 30.60 kg/m    FHT:  FHR: 135 bpm, variability: moderate,  accelerations:  Present,  decelerations:  Absent UC:   irregular, every 7-9 minutes SVE:   Dilation: 5 Effacement (%): 90 Station: -1 Exam by:: Parveen Freehling G. SNM  Labs: Lab Results  Component Value Date   WBC 9.9 05/28/2023   HGB 12.4 05/28/2023   HCT 36.9 05/28/2023   MCV 84.6 05/28/2023   PLT 206 05/28/2023    Assessment / Plan: Protracted latent phase, Category 1 FHT.  Labor: R/B/A of AROM discussed with patient with interpreter. She consented to AROM, clear but scant fluid. Pitocin augmentation per patient request.  Preeclampsia:   N/A Fetal Wellbeing:  Category I Pain Control:  Labor support without medications I/D:  n/a Anticipated MOD:  NSVD  Wilford Hanks, Student-MidWife 05/28/2023, 9:40 AM

## 2023-05-28 NOTE — Lactation Note (Signed)
 This note was copied from a baby's chart. Lactation Consultation Note  Patient Name: Jessica Bender [id number] Date: 05/28/2023 Age:39 hours  Mom chooses to formula feed.   Maternal Data    Feeding    LATCH Score                    Lactation Tools Discussed/Used    Interventions    Discharge    Consult Status Consult Status: Complete    Zebadiah Willert G 05/28/2023, 6:58 PM

## 2023-05-28 NOTE — Progress Notes (Signed)
 Stratus used for explaning how to do peri care and answered all her questions. Patient states she went to the bathroom without any difficulty.

## 2023-05-28 NOTE — Discharge Summary (Signed)
 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 yo [id number] at [redacted]w[redacted]d was admitted in Active Labor on 05/28/2023. Labor course was complicated by N/A   Membrane Rupture Time/Date: 9:27 AM,05/28/2023  Delivery Method:Vaginal, Spontaneous Operative Delivery:N/A Episiotomy: None Lacerations:    Patient had a postpartum course complicated by ***.  She is ambulating, tolerating a regular diet, passing flatus, and urinating well. Patient is discharged home in stable condition on 05/28/23.  Newborn Data: Birth date:05/28/2023 Birth time:11:00 AM Gender:Female Living status:Living Apgars:9 ,9  Weight:   Magnesium Sulfate received: No BMZ received: No Rhophylac:N/A MMR:N/A T-DaP: not given  Flu: N/A declined  RSV Vaccine received: No Transfusion:{Transfusion received:30440034}  Immunizations received: Immunization History  Administered Date(s) Administered   Influenza,inj,Quad PF,6+ Mos 11/23/2018   Tdap 11/23/2018, 08/30/2020    Physical exam  Vitals:   05/28/23 0455  05/28/23 0639 05/28/23 0712  BP: 107/68 (!) 105/58 (!) 106/59  Pulse: 87 87 78  Resp: 18 18   Temp: 98.4 F (36.9 C) 98.4 F (36.9 C) 98.6 F (37 C)  TempSrc: Oral Oral Oral  SpO2: 100%    Weight:  86 kg   Height:  5\' 6"  (1.676 m)    General: {Exam; general:21111117} Lochia: {Desc; appropriate/inappropriate:30686::"appropriate"} Uterine Fundus: {Desc; firm/soft:30687} Incision: {Exam; incision:21111123} DVT Evaluation: {Exam; dvt:2111122} Labs: Lab Results  Component Value Date   WBC 9.9 05/28/2023   HGB 12.4 05/28/2023   HCT 36.9 05/28/2023   MCV 84.6 05/28/2023   PLT 206 05/28/2023      Latest Ref Rng & Units 05/28/2023    5:44 AM  CMP  Glucose 70 - 99 mg/dL 93   BUN 6 - 20 mg/dL 9   Creatinine 1.47 - 8.29 mg/dL 5.62   Sodium 130 - 865 mmol/L 135   Potassium 3.5 - 5.1 mmol/L 3.8   Chloride 98 - 111 mmol/L 105   CO2 22 - 32 mmol/L 19   Calcium 8.9 - 10.3 mg/dL 9.7   Total Protein 6.5 - 8.1 g/dL 6.7   Total Bilirubin 0.0 - 1.2 mg/dL 0.5   Alkaline Phos 38 - 126 U/L 155   AST 15 - 41 U/L 18   ALT 0 - 44 U/L 9    Edinburgh Score:    10/07/2020   10:42 AM  Edinburgh Postnatal Depression Scale Screening Tool  I have been able to laugh and see the funny side  of things. 0  I have looked forward with enjoyment to things. 0  I have blamed myself unnecessarily when things went wrong. 0  I have been anxious or worried for no good reason. 0  I have felt scared or panicky for no good reason. 0  Things have been getting on top of me. 2  I have been so unhappy that I have had difficulty sleeping. 1  I have felt sad or miserable. 2  I have been so unhappy that I have been crying. 0  The thought of harming myself has occurred to me. 0  Edinburgh Postnatal Depression Scale Total 5   No data recorded  After visit meds:  Allergies as of 05/28/2023       Reactions   Pork-derived Products      Med Rec must be completed prior to using this  [facility]***        Discharge home in stable condition Infant Feeding: {Baby feeding:23562} Infant Disposition:{CHL IP OB HOME WITH GEXBMW:41324} Discharge instruction: per After Visit Summary and Postpartum booklet. Activity: Advance as tolerated. Pelvic rest for 6 weeks.  Diet: {OB MWNU:27253664} Future Appointments:No future appointments. Follow up Visit:   Please schedule this patient for a In person postpartum visit in 4 weeks with the following provider: Any provider. Additional Postpartum F/U: IUD    High risk pregnancy complicated by:  grand multip  Delivery mode:  Vaginal, Spontaneous Anticipated Birth Control:  IUD and Condoms  PNC at [facility], No message sent.   05/28/2023 Corie Diamond, CNM

## 2023-05-28 NOTE — Progress Notes (Signed)
 CNM called to patient bedside to review bleeding. Bleeding on pad moderate with 4x4cm clot noted. Fundus firm and bleeding minimal. Plan to watch on L&D for a little while longer and reassess bleeding in 15-30 mins.   Starting Hgb 12.4  EBL at delivery 69. Current QBL:  Jessica Bender) Jessica Singh, MSN, CNM  [facility]  05/28/2023 1:03 PM

## 2023-05-28 NOTE — H&P (Signed)
 OBSTETRIC ADMISSION HISTORY AND PHYSICAL  Jessica Bender is a 38 y.o. female [phone] with IUP at [redacted]w[redacted]d presenting for SOL. She reports +FMs, No LOF, no VB, no blurry vision, headaches or peripheral edema, and RUQ pain.  She plans on breast feeding. She request condoms for birth control. She received her prenatal care at [facility]   Dating: By LMP --->  Estimated Date of Delivery: 06/05/23  Sono:    @[redacted]w[redacted]d , CWD, normal anatomy, cephalic presentation, left lateral placental lie, 2942g, 54% EFW   Prenatal History/Complications:  AMA Grand multiparity  Latent TB  Past Medical History: Past Medical History:  Diagnosis Date   Hearing loss    right ear-    Medical history non-contributory    TB lung, latent    negative chest x-ray 2020    Past Surgical History: Past Surgical History:  Procedure Laterality Date   NO PAST SURGERIES      Obstetrical History: OB History     Gravida  7   Para  5   Term  5   Preterm      AB  1   Living  5      SAB  1   IAB      Ectopic      Multiple  0   Live Births  5           Social History Social History   Socioeconomic History   Marital status: Single    Spouse name: Not on file   Number of children: Not on file   Years of education: Not on file   Highest education level: Not on file  Occupational History   Not on file  Tobacco Use   Smoking status: Never   Smokeless tobacco: Never  Vaping Use   Vaping status: Never Used  Substance and Sexual Activity   Alcohol use: Never   Drug use: Never   Sexual activity: Not Currently    Birth control/protection: None  Other Topics Concern   Not on file  Social History Narrative   Not on file   Social Drivers of Health   Financial Resource Strain: Not on file  Food Insecurity: Food Insecurity Present (08/08/2018)   Hunger Vital Sign    Worried About Running Out of Food in the Last Year: Sometimes true    Ran Out of Food in the Last Year: Sometimes true   Transportation Needs: Unmet Transportation Needs (08/08/2018)   PRAPARE - Administrator, Civil Service (Medical): Yes    Lack of Transportation (Non-Medical): Yes  Physical Activity: Not on file  Stress: Not on file  Social Connections: Not on file    Family History: No family history on file.  Allergies: No Known Allergies  Medications Prior to Admission  Medication Sig Dispense Refill Last Dose/Taking   cyclobenzaprine (FLEXERIL) 5 MG tablet Take 1 tablet (5 mg total) by mouth 3 (three) times daily as needed. 20 tablet 0    Prenatal Vit-Fe Fumarate-FA (MULTIVITAMIN-PRENATAL) 27-0.8 MG TABS tablet Take 1 tablet by mouth daily at 12 noon.        Review of Systems   All systems reviewed and negative except as stated in HPI  currently breastfeeding. General appearance: alert, cooperative, and appears stated age Lungs: clear to auscultation bilaterally Heart: regular rate and rhythm Abdomen: soft, non-tender; bowel sounds normal Pelvic: normal female genitalia Extremities: Homans sign is negative, no sign of DVT Presentation: cephalic Fetal monitoringBaseline: 130 bpm, Variability: Good {> 6  bpm), Accelerations: Reactive, and Decelerations: subtle early vs variables Uterine activity q75mins  Dilation: 4.5 Effacement (%): 100 Station: -3 Exam by:: Bria Torrence   Prenatal labs: ABO, Rh:   Antibody:   Rubella:   RPR:    HBsAg:    HIV:    GBS:      Lab Results  Component Value Date   GBS Negative 08/06/2020   GTT nlr Genetic screening  LR female, AFP neg Anatomy US  nrl  Immunization History  Administered Date(s) Administered   Influenza,inj,Quad PF,6+ Mos 11/23/2018   Tdap 11/23/2018, 08/30/2020    Prenatal Transfer Tool  Maternal Diabetes: No Genetic Screening: Normal Maternal Ultrasounds/Referrals: Normal Fetal Ultrasounds or other Referrals:  None Maternal Substance Abuse:  No Significant Maternal Medications:  None Significant Maternal  Lab Results: Group B Strep negative Number of Prenatal Visits:greater than 3 verified prenatal visits Maternal Vaccinations: declined Other Comments:   Latent TB    No results found for this or any previous visit (from the past 24 hours).  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 [id number] at [redacted]w[redacted]d here for SOL  #Labor:expectant management vs AROM and +/- Pitocin.  #Pain: Per patient request #FWB: Cat 1 #GBS status:  negative #Feeding: Breastmilk  #Reproductive Life planning: Condoms  #AMA #Grand multiparity: @[redacted]w[redacted]d , CWD, normal anatomy, cephalic presentation, left lateral placental lie, 2942g, 54% EFW  #Latent TB  Ebony Goldstein, MD  05/28/2023, 5:17 AM

## 2023-05-28 NOTE — MAU Provider Note (Signed)
 Bloody Urine     S [name] is a 38 y.o. [phone] pregnant female at [redacted]w[redacted]d who presents to MAU today with complaint of bloody urine.  However upon investigation she is having bloody show.  She is having CTX every 3-4 mins.  Denies LOF.  +FM.    Receives care at [facility]. Prenatal records reviewed.  Pertinent items noted in HPI and remainder of comprehensive ROS otherwise negative.   O There were no vitals taken for this visit. Physical Exam Vitals and nursing note reviewed.  Constitutional:      General: She is not in acute distress.    Appearance: Normal appearance. She is not ill-appearing.  HENT:     Head: Normocephalic and atraumatic.     Right Ear: External ear normal.     Left Ear: External ear normal.     Nose: Nose normal.     Mouth/Throat:     Mouth: Mucous membranes are moist.     Pharynx: Oropharynx is clear.  Eyes:     Extraocular Movements: Extraocular movements intact.     Conjunctiva/sclera: Conjunctivae normal.  Cardiovascular:     Rate and Rhythm: Normal rate.  Pulmonary:     Effort: Pulmonary effort is normal. No respiratory distress.  Abdominal:     General: There is no distension.     Palpations: Abdomen is soft.     Tenderness: There is no abdominal tenderness.     Comments: gravid  Musculoskeletal:        General: No swelling. Normal range of motion.     Cervical back: Normal range of motion.  Skin:    General: Skin is warm and dry.  Neurological:     Mental Status: She is alert and oriented to person, place, and time. Mental status is at baseline.     Motor: No weakness.     Gait: Gait normal.  Psychiatric:        Mood and Affect: Mood normal.        Behavior: Behavior normal.   NST: 130bpm, moderate variability, +accels, subtle early vs variability, ctx q30mins    MDM: Low  MAU Course:  Pt with bloody show and CVE went from 2cm to 4.5 today from Monday.  Given multiparous and painful contractions will plan to admit for labor.      AP #[redacted] weeks gestation #Normal Labor - admit to labor  Jessica Goldstein, MD 05/28/2023 5:04 AM

## 2023-05-29 MED ORDER — SENNOSIDES-DOCUSATE SODIUM 8.6-50 MG PO TABS: 2.0000 | ORAL_TABLET | Freq: Every evening | ORAL | 0 refills | Status: AC | PRN

## 2023-05-29 MED ORDER — IBUPROFEN 600 MG PO TABS: 600.0000 mg | ORAL_TABLET | Freq: Four times a day (QID) | ORAL | 0 refills | Status: AC | PRN

## 2023-05-29 MED ORDER — ACETAMINOPHEN 325 MG PO TABS: 650.0000 mg | ORAL_TABLET | ORAL | 0 refills | Status: AC | PRN

## 2023-06-06 ENCOUNTER — Telehealth (HOSPITAL_COMMUNITY): Payer: Self-pay

## 2023-06-06 NOTE — Telephone Encounter (Signed)
 06/06/2023 1425  Name: Jessica Bender MRN: [id number] DOB: [birth year]  Reason for Call:  Transition of Care Hospital Discharge Call  Contact Status: Patient Contact Status: Message  Language assistant needed:          Follow-Up Questions:    Dimple Francis Postnatal Depression Scale:  In the Past 7 Days:    PHQ2-9 Depression Scale:     Discharge Follow-up:    Post-discharge interventions: Reviewed Newborn Safe Sleep Practices  Signature  Delight Bickle,RN

## 2023-06-06 NOTE — Telephone Encounter (Signed)
 06/06/2023  Name: Jessica Bender MRN: [id number] DOB: 10-Jul-1985  Reason for Call:  Transition of Care Hospital Discharge Call  Contact Status: Patient Contact Status: Message Pt had called and left a voicemail so RN attempting to return her call. Call attempted x2.  Language assistant needed: Interpreter Mode: Telephonic Interpreter Interpreter Name: [phone] Interpreter Phone Number - If applicable: [phone]        Follow-Up Questions:    Dimple Francis Postnatal Depression Scale:  In the Past 7 Days:    PHQ2-9 Depression Scale:     Discharge Follow-up:    Post-discharge interventions: NA  Signature  Wadell Guild

## 2023-06-06 NOTE — Telephone Encounter (Signed)
 06/06/2023 1514  Name: Jessica Bender MRN: [id number] DOB: Aug 25, 1985  Reason for Call:  Transition of Care Hospital Discharge Call  Contact Status: Patient Contact Status: Unable to contact (Patient called and RN and patient requested interpreter. RN called patient back with interpreter but patient is not answering phone call. Call attempted x2.)  Language assistant needed: Interpreter Mode: Telephonic Interpreter Interpreter Name: [phone] Interpreter Phone Number - If applicable: [phone]        Follow-Up Questions:    Dimple Francis Postnatal Depression Scale:  In the Past 7 Days:    PHQ2-9 Depression Scale:     Discharge Follow-up:    Post-discharge interventions: NA  Signature  Wadell Guild
